# Patient Record
Sex: Male | Born: 1940 | Race: White | Hispanic: No | Smoking: Never smoker
Health system: Southern US, Community
[De-identification: ages and names within clinical notes are randomized; demographics above are authoritative.]

## PROBLEM LIST (undated history)

## (undated) DIAGNOSIS — N19 Unspecified kidney failure: Secondary | ICD-10-CM

## (undated) DIAGNOSIS — C801 Malignant (primary) neoplasm, unspecified: Secondary | ICD-10-CM

## (undated) DIAGNOSIS — K7460 Unspecified cirrhosis of liver: Secondary | ICD-10-CM

## (undated) DIAGNOSIS — I2510 Atherosclerotic heart disease of native coronary artery without angina pectoris: Secondary | ICD-10-CM

## (undated) HISTORY — PX: COLON SURGERY: SHX602

## (undated) HISTORY — PX: CARDIAC SURGERY: SHX584

---

## 2018-04-03 ENCOUNTER — Emergency Department (HOSPITAL_COMMUNITY): Payer: Medicare Other

## 2018-04-03 ENCOUNTER — Encounter (HOSPITAL_COMMUNITY): Payer: Self-pay | Admitting: Emergency Medicine

## 2018-04-03 ENCOUNTER — Inpatient Hospital Stay (HOSPITAL_COMMUNITY)
Admission: EM | Admit: 2018-04-03 | Discharge: 2018-04-04 | DRG: 682 | Disposition: A | Discharge status: Home / Self Care | Payer: Medicare Other | Attending: Internal Medicine | Admitting: Internal Medicine

## 2018-04-03 DIAGNOSIS — K7031 Alcoholic cirrhosis of liver with ascites: Secondary | ICD-10-CM | POA: Diagnosis present

## 2018-04-03 DIAGNOSIS — Z809 Family history of malignant neoplasm, unspecified: Secondary | ICD-10-CM

## 2018-04-03 DIAGNOSIS — Z992 Dependence on renal dialysis: Secondary | ICD-10-CM

## 2018-04-03 DIAGNOSIS — Z85038 Personal history of other malignant neoplasm of large intestine: Secondary | ICD-10-CM

## 2018-04-03 DIAGNOSIS — J9811 Atelectasis: Secondary | ICD-10-CM | POA: Diagnosis present

## 2018-04-03 DIAGNOSIS — J811 Chronic pulmonary edema: Secondary | ICD-10-CM | POA: Diagnosis not present

## 2018-04-03 DIAGNOSIS — J449 Chronic obstructive pulmonary disease, unspecified: Secondary | ICD-10-CM | POA: Diagnosis present

## 2018-04-03 DIAGNOSIS — R0602 Shortness of breath: Secondary | ICD-10-CM

## 2018-04-03 DIAGNOSIS — I2510 Atherosclerotic heart disease of native coronary artery without angina pectoris: Secondary | ICD-10-CM | POA: Diagnosis present

## 2018-04-03 DIAGNOSIS — K7290 Hepatic failure, unspecified without coma: Secondary | ICD-10-CM | POA: Diagnosis present

## 2018-04-03 DIAGNOSIS — N2581 Secondary hyperparathyroidism of renal origin: Secondary | ICD-10-CM | POA: Diagnosis present

## 2018-04-03 DIAGNOSIS — J90 Pleural effusion, not elsewhere classified: Secondary | ICD-10-CM | POA: Diagnosis not present

## 2018-04-03 DIAGNOSIS — Z9049 Acquired absence of other specified parts of digestive tract: Secondary | ICD-10-CM

## 2018-04-03 DIAGNOSIS — I120 Hypertensive chronic kidney disease with stage 5 chronic kidney disease or end stage renal disease: Principal | ICD-10-CM | POA: Diagnosis present

## 2018-04-03 DIAGNOSIS — Z7982 Long term (current) use of aspirin: Secondary | ICD-10-CM | POA: Diagnosis not present

## 2018-04-03 DIAGNOSIS — Z951 Presence of aortocoronary bypass graft: Secondary | ICD-10-CM | POA: Diagnosis not present

## 2018-04-03 DIAGNOSIS — R609 Edema, unspecified: Secondary | ICD-10-CM

## 2018-04-03 DIAGNOSIS — D631 Anemia in chronic kidney disease: Secondary | ICD-10-CM | POA: Diagnosis present

## 2018-04-03 DIAGNOSIS — Z79899 Other long term (current) drug therapy: Secondary | ICD-10-CM

## 2018-04-03 DIAGNOSIS — R600 Localized edema: Secondary | ICD-10-CM

## 2018-04-03 DIAGNOSIS — N186 End stage renal disease: Secondary | ICD-10-CM | POA: Diagnosis present

## 2018-04-03 HISTORY — DX: Malignant (primary) neoplasm, unspecified: C80.1

## 2018-04-03 HISTORY — DX: Atherosclerotic heart disease of native coronary artery without angina pectoris: I25.10

## 2018-04-03 HISTORY — DX: Unspecified cirrhosis of liver: K74.60

## 2018-04-03 LAB — COMPREHENSIVE METABOLIC PANEL
ALT: 20 U/L (ref 0–44)
AST: 38 U/L (ref 15–41)
Albumin: 3 g/dL — ABNORMAL LOW (ref 3.5–5.0)
Alkaline Phosphatase: 99 U/L (ref 38–126)
Anion gap: 16 — ABNORMAL HIGH (ref 5–15)
BUN: 29 mg/dL — ABNORMAL HIGH (ref 8–23)
CO2: 22 mmol/L (ref 22–32)
Calcium: 8.8 mg/dL — ABNORMAL LOW (ref 8.9–10.3)
Chloride: 98 mmol/L (ref 98–111)
Creatinine, Ser: 3.68 mg/dL — ABNORMAL HIGH (ref 0.61–1.24)
GFR calc non Af Amer: 15 mL/min — ABNORMAL LOW (ref >60)
GFR, EST AFRICAN AMERICAN: 17 mL/min — AB (ref >60)
Glucose, Bld: 111 mg/dL — ABNORMAL HIGH (ref 70–99)
Potassium: 4.4 mmol/L (ref 3.5–5.1)
SODIUM: 136 mmol/L (ref 135–145)
Total Bilirubin: 0.9 mg/dL (ref 0.3–1.2)
Total Protein: 6.6 g/dL (ref 6.5–8.1)

## 2018-04-03 LAB — POCT I-STAT EG7
Acid-Base Excess: 2 mmol/L (ref 0.0–2.0)
Bicarbonate: 25.5 mmol/L (ref 20.0–28.0)
CALCIUM ION: 0.99 mmol/L — AB (ref 1.15–1.40)
HCT: 32 % — ABNORMAL LOW (ref 39.0–52.0)
Hemoglobin: 10.9 g/dL — ABNORMAL LOW (ref 13.0–17.0)
O2 Saturation: 96 %
PCO2 VEN: 33.8 mmHg — AB (ref 44.0–60.0)
Potassium: 4.3 mmol/L (ref 3.5–5.1)
Sodium: 133 mmol/L — ABNORMAL LOW (ref 135–145)
TCO2: 27 mmol/L (ref 22–32)
pH, Ven: 7.485 — ABNORMAL HIGH (ref 7.250–7.430)
pO2, Ven: 73 mmHg — ABNORMAL HIGH (ref 32.0–45.0)

## 2018-04-03 LAB — CBC WITH DIFFERENTIAL/PLATELET
Abs Immature Granulocytes: 0.04 10*3/uL (ref 0.00–0.07)
Basophils Absolute: 0 10*3/uL (ref 0.0–0.1)
Basophils Relative: 0 %
EOS ABS: 0.1 10*3/uL (ref 0.0–0.5)
EOS PCT: 1 %
HCT: 32.4 % — ABNORMAL LOW (ref 39.0–52.0)
Hemoglobin: 10.1 g/dL — ABNORMAL LOW (ref 13.0–17.0)
Immature Granulocytes: 1 %
LYMPHS PCT: 20 %
Lymphs Abs: 1.6 10*3/uL (ref 0.7–4.0)
MCH: 26 pg (ref 26.0–34.0)
MCHC: 31.2 g/dL (ref 30.0–36.0)
MCV: 83.3 fL (ref 80.0–100.0)
Monocytes Absolute: 0.9 10*3/uL (ref 0.1–1.0)
Monocytes Relative: 11 %
Neutro Abs: 5.3 10*3/uL (ref 1.7–7.7)
Neutrophils Relative %: 67 %
Platelets: 153 10*3/uL (ref 150–400)
RBC: 3.89 MIL/uL — ABNORMAL LOW (ref 4.22–5.81)
RDW: 17.3 % — AB (ref 11.5–15.5)
WBC: 8 10*3/uL (ref 4.0–10.5)
nRBC: 0.3 % — ABNORMAL HIGH (ref 0.0–0.2)

## 2018-04-03 LAB — IRON AND TIBC
Iron: 23 ug/dL — ABNORMAL LOW (ref 45–182)
Saturation Ratios: 8 % — ABNORMAL LOW (ref 17.9–39.5)
TIBC: 280 ug/dL (ref 250–450)
UIBC: 257 ug/dL

## 2018-04-03 LAB — MAGNESIUM: Magnesium: 1.9 mg/dL (ref 1.7–2.4)

## 2018-04-03 LAB — PROTIME-INR
INR: 1.4 — ABNORMAL HIGH (ref 0.8–1.2)
Prothrombin Time: 17.1 seconds — ABNORMAL HIGH (ref 11.4–15.2)

## 2018-04-03 LAB — FERRITIN: Ferritin: 180 ng/mL (ref 24–336)

## 2018-04-03 LAB — I-STAT TROPONIN, ED: Troponin i, poc: 0.04 ng/mL (ref 0.00–0.08)

## 2018-04-03 LAB — PHOSPHORUS: Phosphorus: 3.3 mg/dL (ref 2.5–4.6)

## 2018-04-03 LAB — BRAIN NATRIURETIC PEPTIDE: B Natriuretic Peptide: 1612.8 pg/mL — ABNORMAL HIGH (ref 0.0–100.0)

## 2018-04-03 LAB — I-STAT CREATININE, ED: Creatinine, Ser: 3.8 mg/dL — ABNORMAL HIGH (ref 0.61–1.24)

## 2018-04-03 LAB — CBG MONITORING, ED: Glucose-Capillary: 96 mg/dL (ref 70–99)

## 2018-04-03 MED ORDER — ATORVASTATIN CALCIUM 80 MG PO TABS
80.0000 mg | ORAL_TABLET | Freq: Every day | ORAL | Status: DC
  Administered 2018-04-03: 80 mg via ORAL
  Filled 2018-04-03 (×2): qty 1

## 2018-04-03 MED ORDER — HEPARIN SODIUM (PORCINE) 1000 UNIT/ML DIALYSIS
4500.0000 [IU] | INTRAMUSCULAR | Status: DC | PRN
  Administered 2018-04-03: 4500 [IU]

## 2018-04-03 MED ORDER — FUROSEMIDE 40 MG PO TABS: 40.0000 mg | ORAL_TABLET | Freq: Every day | ORAL | Status: DC

## 2018-04-03 MED ORDER — LIDOCAINE HCL (PF) 1 % IJ SOLN: 5.0000 mL | INTRAMUSCULAR | Status: DC | PRN

## 2018-04-03 MED ORDER — SODIUM CHLORIDE 0.9 % IV SOLN: 100.0000 mL | INTRAVENOUS | Status: DC | PRN

## 2018-04-03 MED ORDER — FOLIC ACID 1 MG PO TABS
1.0000 mg | ORAL_TABLET | Freq: Every day | ORAL | Status: DC
  Administered 2018-04-03: 1 mg via ORAL
  Filled 2018-04-03 (×2): qty 1

## 2018-04-03 MED ORDER — CHLORHEXIDINE GLUCONATE CLOTH 2 % EX PADS: 6.0000 | MEDICATED_PAD | Freq: Every day | CUTANEOUS | Status: DC

## 2018-04-03 MED ORDER — ASPIRIN EC 81 MG PO TBEC
81.0000 mg | DELAYED_RELEASE_TABLET | Freq: Every day | ORAL | Status: DC
  Filled 2018-04-03: qty 1

## 2018-04-03 MED ORDER — PANTOPRAZOLE SODIUM 20 MG PO TBEC
20.0000 mg | DELAYED_RELEASE_TABLET | Freq: Every day | ORAL | Status: DC
  Filled 2018-04-03: qty 1

## 2018-04-03 MED ORDER — HEPARIN SODIUM (PORCINE) 1000 UNIT/ML IJ SOLN
INTRAMUSCULAR | Status: AC
  Filled 2018-04-03: qty 5

## 2018-04-03 MED ORDER — PENTAFLUOROPROP-TETRAFLUOROETH EX AERO: 1.0000 "application " | INHALATION_SPRAY | CUTANEOUS | Status: DC | PRN

## 2018-04-03 MED ORDER — HEPARIN SODIUM (PORCINE) 1000 UNIT/ML DIALYSIS: 40.0000 [IU]/kg | INTRAMUSCULAR | Status: DC | PRN

## 2018-04-03 MED ORDER — LACTULOSE 10 GM/15ML PO SOLN
5.0000 g | Freq: Two times a day (BID) | ORAL | Status: DC
  Administered 2018-04-04: 5 g via ORAL
  Filled 2018-04-03 (×3): qty 15

## 2018-04-03 MED ORDER — VITAMIN B-1 100 MG PO TABS
100.0000 mg | ORAL_TABLET | Freq: Every day | ORAL | Status: DC
  Filled 2018-04-03: qty 1

## 2018-04-03 MED ORDER — HEPARIN SODIUM (PORCINE) 1000 UNIT/ML DIALYSIS
1000.0000 [IU] | INTRAMUSCULAR | Status: DC | PRN
  Filled 2018-04-03: qty 1

## 2018-04-03 MED ORDER — LIDOCAINE-PRILOCAINE 2.5-2.5 % EX CREA
1.0000 "application " | TOPICAL_CREAM | CUTANEOUS | Status: DC | PRN
  Filled 2018-04-03: qty 5

## 2018-04-03 MED ORDER — MIRTAZAPINE 15 MG PO TABS
7.5000 mg | ORAL_TABLET | Freq: Every day | ORAL | Status: DC
  Administered 2018-04-03: 15 mg via ORAL
  Filled 2018-04-03 (×2): qty 1

## 2018-04-03 MED ORDER — ACETAMINOPHEN 650 MG RE SUPP: 650.0000 mg | Freq: Four times a day (QID) | RECTAL | Status: DC | PRN

## 2018-04-03 MED ORDER — ALTEPLASE 2 MG IJ SOLR
2.0000 mg | Freq: Once | INTRAMUSCULAR | Status: DC | PRN
  Filled 2018-04-03: qty 2

## 2018-04-03 MED ORDER — HEPARIN SODIUM (PORCINE) 5000 UNIT/ML IJ SOLN
5000.0000 [IU] | Freq: Three times a day (TID) | INTRAMUSCULAR | Status: DC
  Filled 2018-04-03 (×2): qty 1

## 2018-04-03 MED ORDER — ACETAMINOPHEN 325 MG PO TABS: 650.0000 mg | ORAL_TABLET | Freq: Four times a day (QID) | ORAL | Status: DC | PRN

## 2018-04-03 NOTE — H&P (Addendum)
Date: 04/03/2018               Patient Name:  Larry Fitzgerald MRN: [id number]  DOB: [birth year] Age / Sex: 78 y.o., male   PCP: Mancel Bale, PA-C         Medical Service: Internal Medicine Teaching Service         Attending Physician: Dr. Aldine Contes, MD    First Contact: Dr. Myrtie Hawk Pager: [phone]  Second Contact: Dr. Shan Levans Pager: [phone]       After Hours (After 5p/  First Contact Pager: [phone]  weekends / holidays): Second Contact Pager: [phone]   Chief Complaint: Lower extremity edema  History of Present Illness: Larry Fitzgerald is a 78 year old male with past medical history of ESRD, started on hemodialysis on January (from CABG), CAD status post CABG, colon cancer, cirrhosis, came into the ED to getting hemodialysis.  Patient's daughter is in the room, explaining that patient lives in [place]. He was admitted in  hospital there on January, got CABG, and started with hemodialysis due to new kidney failure.  He was then discharged to SNF and then to home with home health.  However per her daughter, he needed more care and moved to [place] to live with his daughter.  He missed his last hemodialysis. (waiting for the process to get set up for dialysis here in [place])  He developed some lower extremity edema. Daughters mentions that he is otherwise asymptomatic and overally doing ok.No shortness of breath, no N/V.   Meds:  Current Meds  Medication Sig  . amLODipine (NORVASC) 10 MG tablet Take 10 mg by mouth daily.  Marland Kitchen aspirin EC 81 MG tablet Take 81 mg by mouth daily.  Marland Kitchen atorvastatin (LIPITOR) 80 MG tablet Take 80 mg by mouth daily.  . citalopram (CELEXA) 20 MG tablet Take 20 mg by mouth daily.  . furosemide (LASIX) 40 MG tablet Take 40 mg by mouth daily.  Marland Kitchen lactulose (CHRONULAC) 10 GM/15ML solution Take 20 g by mouth 2 (two) times daily. 5 ml twice daily  . metoprolol tartrate (LOPRESSOR) 25 MG tablet Take 25 mg by mouth 2 (two) times daily.  . mirtazapine  (REMERON) 15 MG tablet Take 7.5 mg by mouth at bedtime.  . Misc Natural Products (GLUCOS-CHONDROIT-MSM COMPLEX PO) Take 1 capsule by mouth daily.  . Multiple Vitamins-Minerals (EQ MULTIVITAMINS ADULT GUMMY) CHEW Chew 1 tablet by mouth daily.  Marland Kitchen omeprazole (PRILOSEC) 20 MG capsule Take 20 mg by mouth daily.     Allergies: Allergies as of 04/03/2018 - Review Complete 04/03/2018  Allergen Reaction Noted  . Penicillins Anaphylaxis 04/03/2018   Past Medical History:  Diagnosis Date  . Cancer ([place])    colon ca   . Coronary artery disease   . Liver cirrhosis ([place])     Family History:  Mother had Cancer  No other known family Hx.   Social History:  Patient denies smoking, stopped drinking alcohol 3 years ago, denies illicit drug use  Review of Systems: A complete ROS was negative except as per HPI.   Physical Exam: Blood pressure (!) 102/59, pulse (!) 105, temperature 98 F (36.7 C), temperature source Oral, resp. rate 19, SpO2 95 %. Physical Exam Constitutional:      Appearance: He is ill-appearing.  HENT:     Head: Normocephalic and atraumatic.  Eyes:     Extraocular Movements: Extraocular movements intact.  Cardiovascular:     Rate and Rhythm: Tachycardia present.  Heart sounds: No murmur.  Pulmonary:     Effort: Pulmonary effort is normal.     Breath sounds: Examination of the right-lower field reveals decreased breath sounds. Examination of the left-lower field reveals decreased breath sounds. Decreased breath sounds present.  Abdominal:     General: Bowel sounds are normal. There is no distension.     Palpations: Abdomen is soft.     Tenderness: There is no abdominal tenderness.  Musculoskeletal:     Right lower leg: Edema present.     Left lower leg: Edema present.     Comments: 2-3+ LEE  Neurological:     General: No focal deficit present.     Mental Status: Mental status is at baseline.     EKG: personally reviewed my interpretation is sinus tachycardia  with non specific ST-T changes  CXR: personally reviewed my interpretation is lateral pleural effusion worse at left side.  Assessment & Plan by Problem: Active Problems:   ESRD (end stage renal disease) on dialysis [facility])  78 year old male past medical history of ESRD-acute renal failure from CABG, Cirrhosis, CABG Jan 2020, resented to ED to get hemodialysis.  ESRD: Started on HD on January. Last HD on Monday 03/31/2018 Was waiting in the process of getting set in HD center in [place] nephrology (will give multiple sessions while here to extend him to getting setup outpatient) -Follow HD orders -CBC -Renal function test -Follow-up PTH -INR  CAD s/p CABG: Symptomatic -Continue aspirin grams daily -Lipitor 80 mg daily  History of cirrhosis secondary to alcohol use -Vitamin B1 Meds: asa, remeron VTE ppx: subq hep IVF: none Diet: renal  Diet: Renal IV fluid: None VTE ppx: Subq Hep Code status: Full Dispo: Admit patient to Inpatient with expected length of stay greater than 2 midnights.  SignedDewayne Hatch, MD 04/03/2018, 2:11 PM  Pager: [phone]

## 2018-04-03 NOTE — ED Notes (Signed)
Pt ambulating to rest room.

## 2018-04-03 NOTE — Procedures (Signed)
Patient seen on Hemodialysis. BP (!) 110/59   Pulse (!) 105   Temp 97.6 F (36.4 C) (Oral)   Resp 18   Wt 76 kg   SpO2 91%   QB 400, UF goal 2.5L Tolerating treatment without complaints at this time.   Elmarie Shiley MD [facility]. Office # [phone] Pager # [phone] 4:46 PM

## 2018-04-03 NOTE — Consult Note (Signed)
Reason for Consult: End-stage renal disease, volume overload-attempting to transfer care Referring Physician: Aldine Contes M.D. ([facility])  HPI:  78 year old Caucasian man with past medical history significant for history of COPD, colon cancer status post partial colectomy in 2016, alcohol associated cirrhosis of the liver with prior history of recurrent ascites (resolved), history of thrombocytopenia, coronary artery disease status post three-vessel CABG 6 weeks ago complicated by development of dialysis dependent acute kidney injury since 03/01/2018.  He was discharged from [facility] to a local skilled nursing facility and was getting hemodialysis in [place], [place]-last treatment on Monday, 03/31/2018 following which the family decided to move him to [place] to be able to assist him at home.  Unfortunately, there were no firm plans for continued delivery of dialysis made prior to transfer.  He was seen 2 days ago at [facility] family medicine for concerns with cough and was noted to have significant edema.  He was then directed to the emergency room for further assistance.  In the emergency room he was noted to have significant lower extremity edema with bilateral large pleural effusions and compressive atelectasis.  He reports some shortness of breath but is otherwise comfortable without any fevers or chills and has had some cough with exertional dyspnea.  He denies any nausea, vomiting or diarrhea.  He reports that he has been producing some urine but is unable to quantify this.  Per his daughter, he had attempted 24-hour collection of urine that did not show sufficient renal recovery to allow stopping dialysis.  He has a right IJ TDC.  Past Medical History:  Diagnosis Date  . Cancer ([place])    colon ca   . Coronary artery disease   . Liver cirrhosis [facility])     Past Surgical History:  Procedure Laterality Date  . CARDIAC SURGERY     CABG   . COLON SURGERY      No family history on  file.  Social History:  reports that he has never smoked. He has never used smokeless tobacco. He reports previous alcohol use. He reports that he does not use drugs.  Allergies:  Allergies  Allergen Reactions  . Penicillins Anaphylaxis    Did it involve swelling oYesf the face/tongue/throat, SOB, or low BP? Yes Did it involve sudden or severe rash/hives, skin peeling, or any reaction on the inside of your mouth or nose?  Did you need to seek medical attention at a hospital or doctor's office? Yes When did it last happen? If all above answers are "NO", may proceed with cephalosporin use.    Medications:  Scheduled: . [START ON 04/04/2018] aspirin EC  81 mg Oral Daily  . atorvastatin  80 mg Oral Daily  . [START ON 04/04/2018] Chlorhexidine Gluconate Cloth  6 each Topical Q0600  . folic acid  1 mg Oral Daily  . [START ON 04/04/2018] furosemide  40 mg Oral Daily  . heparin  5,000 Units Subcutaneous Q8H  . lactulose  5 g Oral BID  . mirtazapine  7.5 mg Oral QHS  . [START ON 04/04/2018] pantoprazole  20 mg Oral Daily  . thiamine  100 mg Oral Daily    BMP Latest Ref Rng & Units 04/03/2018 04/03/2018  Glucose 70 - 99 mg/dL - 111(H)  BUN 8 - 23 mg/dL - 29(H)  Creatinine 0.61 - 1.24 mg/dL 3.80(H) 3.68(H)  Sodium 135 - 145 mmol/L 133(L) 136  Potassium 3.5 - 5.1 mmol/L 4.3 4.4  Chloride 98 - 111 mmol/L - 98  CO2 22 - 32 mmol/L - 22  Calcium 8.9 - 10.3 mg/dL - 8.8(L)   CBC Latest Ref Rng & Units 04/03/2018 04/03/2018  WBC 4.0 - 10.5 K/uL - 8.0  Hemoglobin 13.0 - 17.0 g/dL 10.9(L) 10.1(L)  Hematocrit 39.0 - 52.0 % 32.0(L) 32.4(L)  Platelets 150 - 400 K/uL - 153     Dg Chest Port 1 View  Result Date: 04/03/2018 CLINICAL DATA:  Patient in need of dialysis. Pitting edema. EXAM: PORTABLE CHEST 1 VIEW COMPARISON:  None. FINDINGS: Dialysis catheter terminates at the cavoatrial junction. Postsurgical changes from CABG. Dual lead cardiac defibrillator pacemaker in appropriate radiographic  position. Enlarged cardiac silhouette.  Mediastinal contours appear intact. Bilateral large pleural effusions with associated compressive atelectasis in the lung bases. Mild interstitial pulmonary edema. Osseous structures are without acute abnormality. Soft tissues are grossly normal. IMPRESSION: 1. Bilateral large pleural effusions with associated compressive atelectasis. 2. Interstitial pulmonary edema. 3. Enlarged cardiac silhouette. Electronically Signed   By: Fidela Salisbury M.D.   On: 04/03/2018 10:41    Review of Systems  Constitutional: Positive for malaise/fatigue. Negative for chills, fever and weight loss.  HENT: Negative.   Eyes: Negative.   Respiratory: Positive for cough and shortness of breath. Negative for hemoptysis, sputum production and wheezing.   Cardiovascular: Positive for orthopnea and leg swelling. Negative for chest pain, palpitations and claudication.  Gastrointestinal: Negative.   Genitourinary: Negative.   Musculoskeletal: Negative.   Skin: Negative.   Neurological: Positive for weakness.       Global weakness-following recent hospitalization   Blood pressure (!) 102/59, pulse (!) 105, temperature 98 F (36.7 C), temperature source Oral, resp. rate 20, SpO2 95 %. Physical Exam  Nursing note and vitals reviewed. Constitutional: He is oriented to person, place, and time. He appears well-developed and well-nourished. No distress.  HENT:  Head: Normocephalic and atraumatic.  Nose: Nose normal.  Mouth/Throat: Oropharynx is clear and moist.  Eyes: Pupils are equal, round, and reactive to light. Conjunctivae and EOM are normal. No scleral icterus.  Neck: Normal range of motion. Neck supple. JVD present.  Cardiovascular: Regular rhythm and normal heart sounds.  No murmur heard. Regular tachycardia, midline chest incision consistent with recent CABG  Respiratory: He has rales.  Diminished breath sounds over bilateral bases with poor inspiratory effort, fine  rales mid lung base.  Right IJ TDC  GI: Bowel sounds are normal. There is no abdominal tenderness. There is no rebound and no guarding.  Musculoskeletal: Normal range of motion.        General: Edema present.     Comments: 4+ pitting lower extremity edema  Neurological: He is alert and oriented to person, place, and time. No cranial nerve deficit.  Skin: Skin is warm and dry. There is pallor.  Psychiatric: He has a normal mood and affect. His behavior is normal.    Assessment/Plan: 1.  Acute kidney injury: Dialysis dependent for the past 4 weeks or so without any evidence of significant renal recovery based on his daughter's report regarding 24-hour urine collection.  Significant volume overload.  Will order for hemodialysis today and then again tomorrow anticipating discharge thereafter following which he can continue dialysis as an outpatient at the [facility] unit on a Tuesday/Thursday/Saturday schedule beginning Saturday, 04/05/2018.  We had a lengthy discussion regarding the trajectory of his management-if he does not have recovery over the next 2 weeks noted, will refer him to vascular surgery for placement of dialysis access.  Will check hepatitis B  serologies. 2.  Volume overload: Secondary to acute kidney injury and delayed renal recovery/poor urine output.  Will attempt frequent dialysis for the next 3 days and then scheduled hemodialysis as an outpatient. 3.  Anemia of chronic kidney disease: Hemoglobin and hematocrit are at goal, will resume ESA with hemodialysis.  Will check iron studies today. 4.  Secondary hyperparathyroidism: Calcium level acceptable, will add phosphorus and magnesium levels to labs previously drawn to decide on need for binders.  Will check PTH today. 5.  Coronary artery disease status post recent CABG: Will likely need to reestablish care with cardiology here for ongoing continuity of care. 6.  History of cirrhosis: Reiterated the importance of continued abstinence  from alcohol.  LFTs/bilirubin indicative of decent hepatic function.  Tylin Force K. 04/03/2018, 3:00 PM

## 2018-04-03 NOTE — ED Provider Notes (Signed)
Larry Fitzgerald   CSN: [id number] Arrival date & time: 04/03/18  3149    History   Chief Complaint Chief Complaint  Patient presents with  . Leg Swelling  . Vascular Access Problem    HPI Larry Fitzgerald is a 78 y.o. male.     The history is provided by the patient, medical records and a relative. No language interpreter was used.   Larry Fitzgerald is a 78 y.o. male who presents to the Emergency Department complaining of "needs dialysis." He presents to the emergency department accompanied by his daughter for evaluation of establishing dialysis. He has a complicated medical history and was admitted to a [facility] [place] two months ago. During his hospitalization he had a CABG and developed ESRD requiring hemodialysis as well as ascites and liver failure. He was discharged following his hospitalization to a rehab facility and was discharged from the rehab facility two weeks ago. His last dialysis session was on Monday. When his daughter came to visit he was complaining of weakness and fatigue and she brought him to [facility] so he can be closer to assistance. She has been attempting to establish him with outpatient dialysis but has been unable to do so locally and she brings him to the ER to get established with dialysis. He complains of worsening bilateral lower extremity edema as well as tachypnea, generalized weakness and malaise. No reports of fevers, chest pain, vomiting. Past Medical History:  Diagnosis Date  . Cancer ([place])    colon ca   . Coronary artery disease   . Liver cirrhosis [facility])     Patient Active Problem List   Diagnosis Date Noted  . ESRD (end stage renal disease) on dialysis ([place]) 04/03/2018    Past Surgical History:  Procedure Laterality Date  . CARDIAC SURGERY     CABG   . COLON SURGERY          Home Medications    Prior to Admission medications   Medication Sig Start Date  End Date Taking? Authorizing Provider  amLODipine (NORVASC) 10 MG tablet Take 10 mg by mouth daily. 02/19/18  Yes [provider]  aspirin EC 81 MG tablet Take 81 mg by mouth daily.   Yes [provider]  atorvastatin (LIPITOR) 80 MG tablet Take 80 mg by mouth daily. 02/19/18  Yes [provider]  citalopram (CELEXA) 20 MG tablet Take 20 mg by mouth daily. 03/13/18 04/12/18 Yes [provider]  furosemide (LASIX) 40 MG tablet Take 40 mg by mouth daily. 12/17/17  Yes [provider]  lactulose (CHRONULAC) 10 GM/15ML solution Take 20 g by mouth 2 (two) times daily. 5 ml twice daily 08/30/17  Yes [provider]  metoprolol tartrate (LOPRESSOR) 25 MG tablet Take 25 mg by mouth 2 (two) times daily. 03/13/18 04/12/18 Yes [provider]  mirtazapine (REMERON) 15 MG tablet Take 7.5 mg by mouth at bedtime.   Yes [provider]  Misc Natural Products (GLUCOS-CHONDROIT-MSM COMPLEX PO) Take 1 capsule by mouth daily.   Yes [provider]  Multiple Vitamins-Minerals (EQ MULTIVITAMINS ADULT GUMMY) CHEW Chew 1 tablet by mouth daily.   Yes [provider]  omeprazole (PRILOSEC) 20 MG capsule Take 20 mg by mouth daily. 01/20/16  Yes [provider]    Family History No family history on file.  Social History Social History   Tobacco Use  . Smoking status: Never Smoker  . Smokeless  tobacco: Never Used  Substance Use Topics  . 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 Drug use: Never     Allergies   Penicillins   Review of Systems Review of Systems  All other systems reviewed and are negative.    Physical Exam Updated Vital Signs BP 108/62 (BP Location: Right Arm)   Pulse (!) 106   Temp 97.6 F (36.4 C) (Oral)   Resp 18   Wt 76 kg   SpO2 91%   Physical Exam Vitals signs and nursing Fitzgerald reviewed.  Constitutional:      Appearance: He is well-developed.  He is ill-appearing.  HENT:     Head: Normocephalic and atraumatic.  Cardiovascular:     Rate and Rhythm: Regular rhythm.     Heart sounds: No murmur.     Comments: tachycardic Pulmonary:     Effort: Pulmonary effort is normal. No respiratory distress.     Comments: Decreased air movement in bilateral bases, left greater than right.  Abdominal:     Palpations: Abdomen is soft.     Tenderness: There is no abdominal tenderness. There is no guarding or rebound.  Musculoskeletal:        General: No tenderness.     Comments: 3+ pitting edema to BLE  Skin:    General: Skin is warm and dry.     Coloration: Skin is pale.  Neurological:     Mental Status: He is alert and oriented to person, place, and time.  Psychiatric:        Behavior: Behavior normal.      ED Treatments / Results  Labs (all labs ordered are listed, but only abnormal results are displayed) Labs Reviewed  COMPREHENSIVE METABOLIC PANEL - Abnormal; Notable for the following components:      Result Value   Glucose, Bld 111 (*)    BUN 29 (*)    Creatinine, Ser 3.68 (*)    Calcium 8.8 (*)    Albumin 3.0 (*)    GFR calc non Af Amer 15 (*)    GFR calc Af Amer 17 (*)    Anion gap 16 (*)    All other components within normal limits  CBC WITH DIFFERENTIAL/PLATELET - Abnormal; Notable for the following components:   RBC 3.89 (*)    Hemoglobin 10.1 (*)    HCT 32.4 (*)    RDW 17.3 (*)    nRBC 0.3 (*)    All other components within normal limits  BRAIN NATRIURETIC PEPTIDE - Abnormal; Notable for the following components:   B Natriuretic Peptide 1,612.8 (*)    All other components within normal limits  PROTIME-INR - Abnormal; Notable for the following components:   Prothrombin Time 17.1 (*)    INR 1.4 (*)    All other components within normal limits  I-STAT CREATININE, ED - Abnormal; Notable for the following components:   Creatinine, Ser 3.80 (*)    All other components within normal limits  POCT I-STAT EG7 -  Abnormal; Notable for the following components:   pH, Ven 7.485 (*)    pCO2, Ven 33.8 (*)    pO2, Ven 73.0 (*)    Sodium 133 (*)    Calcium, Ion 0.99 (*)    HCT 32.0 (*)    Hemoglobin 10.9 (*)    All other components within normal limits  PHOSPHORUS  MAGNESIUM  HEPATITIS B SURFACE ANTIGEN  HEPATITIS B  SURFACE ANTIGEN  HEPATITIS B SURFACE ANTIBODY,QUALITATIVE  HEPATITIS B CORE ANTIBODY, TOTAL  PARATHYROID HORMONE, INTACT (NO CA)  FERRITIN  IRON AND TIBC  CBG MONITORING, ED  I-STAT TROPONIN, ED    EKG EKG Interpretation  Date/Time:  Thursday April 03 2018 10:04:33 EST Ventricular Rate:  106 PR Interval:    QRS Duration: 102 QT Interval:  347 QTC Calculation: 461 R Axis:   100 Text Interpretation:  Sinus tachycardia Anterior infarct, old Nonspecific repol abnrm, inferolateral lds no prior available for comparison Confirmed by Quintella Reichert [phone]) on 04/03/2018 10:28:00 AM   Radiology Dg Chest Port 1 View  Result Date: 04/03/2018 CLINICAL DATA:  Patient in need of dialysis. Pitting edema. EXAM: PORTABLE CHEST 1 VIEW COMPARISON:  None. FINDINGS: Dialysis catheter terminates at the cavoatrial junction. Postsurgical changes from CABG. Dual lead cardiac defibrillator pacemaker in appropriate radiographic position. Enlarged cardiac silhouette.  Mediastinal contours appear intact. Bilateral large pleural effusions with associated compressive atelectasis in the lung bases. Mild interstitial pulmonary edema. Osseous structures are without acute abnormality. Soft tissues are grossly normal. IMPRESSION: 1. Bilateral large pleural effusions with associated compressive atelectasis. 2. Interstitial pulmonary edema. 3. Enlarged cardiac silhouette. Electronically Signed   By: Fidela Salisbury M.D.   On: 04/03/2018 10:41    Procedures Procedures (including critical care time)  Medications Ordered in ED Medications  heparin injection 5,000 Units (has no administration in time range)    folic acid (FOLVITE) tablet 1 mg (has no administration in time range)  thiamine (VITAMIN B-1) tablet 100 mg (has no administration in time range)  acetaminophen (TYLENOL) tablet 650 mg (has no administration in time range)    Or  acetaminophen (TYLENOL) suppository 650 mg (has no administration in time range)  aspirin EC tablet 81 mg (has no administration in time range)  atorvastatin (LIPITOR) tablet 80 mg (has no administration in time range)  furosemide (LASIX) tablet 40 mg (has no administration in time range)  lactulose (CHRONULAC) 10 GM/15ML solution 5 g (has no administration in time range)  mirtazapine (REMERON) tablet 7.5 mg (has no administration in time range)  pantoprazole (PROTONIX) EC tablet 20 mg (has no administration in time range)  Chlorhexidine Gluconate Cloth 2 % PADS 6 each (has no administration in time range)  pentafluoroprop-tetrafluoroeth (GEBAUERS) aerosol 1 application (has no administration in time range)  lidocaine (PF) (XYLOCAINE) 1 % injection 5 mL (has no administration in time range)  lidocaine-prilocaine (EMLA) cream 1 application (has no administration in time range)  0.9 %  sodium chloride infusion (has no administration in time range)  0.9 %  sodium chloride infusion (has no administration in time range)  heparin injection 1,000 Units (has no administration in time range)  alteplase (CATHFLO ACTIVASE) injection 2 mg (has no administration in time range)  heparin injection 40 Units/kg (has no administration in time range)  heparin injection 40 Units/kg (has no administration in time range)     Initial Impression / Assessment and Plan / ED Course  I have reviewed the triage vital signs and the nursing notes.  Pertinent labs & imaging results that were available during my care of the patient were reviewed by me and considered in my medical decision making (see chart for details).        Pt with ESRD, CHF here for evaluation for dialysis. He is  tachycardic, tachypnea, evaluation. Chest x-ray demonstrates bilateral pleural effusion's, left greater than right. He is volume overload on examination. Discussed with Dr. Posey Pronto with nephrology regarding  dialysis. Medicine consulted for admission for further treatment.  Patient and daughter updated of findings of studies and recommendation for admission.    Final Clinical Impressions(s) / ED Diagnoses   Final diagnoses:  None    ED Discharge Orders    None       Quintella Reichert, MD 04/03/18 [phone]

## 2018-04-03 NOTE — ED Notes (Signed)
Report called to Dialysis.

## 2018-04-03 NOTE — ED Triage Notes (Signed)
Pt arrives with daughter with a c/o of needing dialysis due to patient being discharged from [facility] following CABG which resulted in AKI with placement of catheter in right upper chest. Pt had dialysis on Monday at [facility] and per daughter was told to come to ED for dialysis due to pt not having a place to go. Pt has +3 bilateral pitting edema. Pt was prescribed 2L [place] upon discharge but left tank at home. Pt denies any sob or chest pain.

## 2018-04-04 ENCOUNTER — Other Ambulatory Visit: Payer: Self-pay

## 2018-04-04 DIAGNOSIS — Z85038 Personal history of other malignant neoplasm of large intestine: Secondary | ICD-10-CM

## 2018-04-04 DIAGNOSIS — Z951 Presence of aortocoronary bypass graft: Secondary | ICD-10-CM

## 2018-04-04 DIAGNOSIS — Z7982 Long term (current) use of aspirin: Secondary | ICD-10-CM

## 2018-04-04 DIAGNOSIS — K7030 Alcoholic cirrhosis of liver without ascites: Secondary | ICD-10-CM | POA: Diagnosis not present

## 2018-04-04 DIAGNOSIS — I120 Hypertensive chronic kidney disease with stage 5 chronic kidney disease or end stage renal disease: Secondary | ICD-10-CM | POA: Diagnosis not present

## 2018-04-04 DIAGNOSIS — E8770 Fluid overload, unspecified: Secondary | ICD-10-CM | POA: Diagnosis not present

## 2018-04-04 DIAGNOSIS — N186 End stage renal disease: Secondary | ICD-10-CM | POA: Diagnosis not present

## 2018-04-04 DIAGNOSIS — R000 Tachycardia, unspecified: Secondary | ICD-10-CM

## 2018-04-04 DIAGNOSIS — D649 Anemia, unspecified: Secondary | ICD-10-CM

## 2018-04-04 DIAGNOSIS — Z880 Allergy status to penicillin: Secondary | ICD-10-CM

## 2018-04-04 DIAGNOSIS — Z79899 Other long term (current) drug therapy: Secondary | ICD-10-CM

## 2018-04-04 DIAGNOSIS — I2510 Atherosclerotic heart disease of native coronary artery without angina pectoris: Secondary | ICD-10-CM

## 2018-04-04 DIAGNOSIS — Z992 Dependence on renal dialysis: Secondary | ICD-10-CM

## 2018-04-04 LAB — CBC
HCT: 31.2 % — ABNORMAL LOW (ref 39.0–52.0)
Hemoglobin: 9.3 g/dL — ABNORMAL LOW (ref 13.0–17.0)
MCH: 23.3 pg — ABNORMAL LOW (ref 26.0–34.0)
MCHC: 29.8 g/dL — ABNORMAL LOW (ref 30.0–36.0)
MCV: 78.2 fL — ABNORMAL LOW (ref 80.0–100.0)
NRBC: 0 % (ref 0.0–0.2)
Platelets: 94 10*3/uL — ABNORMAL LOW (ref 150–400)
RBC: 3.99 MIL/uL — ABNORMAL LOW (ref 4.22–5.81)
RDW: 16.8 % — ABNORMAL HIGH (ref 11.5–15.5)
WBC: 6.3 10*3/uL (ref 4.0–10.5)

## 2018-04-04 LAB — HEPATITIS B CORE ANTIBODY, TOTAL: Hep B Core Total Ab: NEGATIVE

## 2018-04-04 LAB — PROTIME-INR
INR: 1.5 — ABNORMAL HIGH (ref 0.8–1.2)
PROTHROMBIN TIME: 17.9 s — AB (ref 11.4–15.2)

## 2018-04-04 LAB — HEPATITIS B SURFACE ANTIGEN
HEP B S AG: NEGATIVE
Hepatitis B Surface Ag: NEGATIVE

## 2018-04-04 LAB — HEPATITIS B SURFACE ANTIBODY,QUALITATIVE: Hep B S Ab: NONREACTIVE

## 2018-04-04 LAB — MRSA PCR SCREENING: MRSA by PCR: NEGATIVE

## 2018-04-04 MED ORDER — HEPARIN SODIUM (PORCINE) 1000 UNIT/ML IJ SOLN
INTRAMUSCULAR | Status: AC
  Filled 2018-04-04: qty 5

## 2018-04-04 NOTE — Progress Notes (Signed)
Patient ID: Larry Fitzgerald, male   DOB: Sep 08, 1940, 78 y.o.   MRN: [id number]  [facility] Progress Note    Subjective:    no new complaints but asking when he can go home   Objective:   BP (!) 108/56   Pulse (!) 109   Temp 97.6 F (36.4 C) (Oral)   Resp 19   Ht 5\' 7"  (1.702 m)   Wt 74 kg   SpO2 (!) 89%   BMI 25.55 kg/m   Intake/Output: I/O last 3 completed shifts: In: 75 [P.O.:460] Out: 2100 [Other:2100]   Intake/Output this shift:  Total I/O In: 300 [P.O.:300] Out: 0  Weight change:   Physical Exam: Gen: frail elderly WM in NAD CVS: tachy Resp: bibasilar crackles Abd: +BS, soft, NT/ND Ext: 3+ edema of lower ext.  Labs: BMET Recent Labs  Lab 04/03/18 1046 04/03/18 1052 04/03/18 1537 04/04/18 0421  NA 136 133*  --  136  K 4.4 4.3  --  3.1*  CL 98  --   --  99  CO2 22  --   --  26  GLUCOSE 111*  --   --  97  BUN 29*  --   --  12  CREATININE 3.68* 3.80*  --  2.16*  ALBUMIN 3.0*  --   --  2.7*  CALCIUM 8.8*  --   --  7.8*  PHOS  --   --  3.3  --    CBC Recent Labs  Lab 04/03/18 1046 04/03/18 1052 04/04/18 0421  WBC 8.0  --  6.3  NEUTROABS 5.3  --   --   HGB 10.1* 10.9* 9.3*  HCT 32.4* 32.0* 31.2*  MCV 83.3  --  78.2*  PLT 153  --  94*    @IMGRELPRIORS @ Medications:    . aspirin EC  81 mg Oral Daily  . atorvastatin  80 mg Oral Daily  . Chlorhexidine Gluconate Cloth  6 each Topical Q0600  . folic acid  1 mg Oral Daily  . furosemide  40 mg Oral Daily  . heparin  5,000 Units Subcutaneous Q8H  . lactulose  5 g Oral BID  . mirtazapine  7.5 mg Oral QHS  . pantoprazole  20 mg Oral Daily  . thiamine  100 mg Oral Daily     Assessment/ Plan:   1. Volume overload- due to lack of HD after being moved without arranging for outpatient HD.  Improving with HD and UF.  2. ESRD  Arranging for outpatient HD here in [place] 3. Vascular access- has rij tdc and left PPM, will need referral to vascular surgery for AVG/AVF as an  outpatient.  4. 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 Nutrition: renal diet 7. Hypertension: stable 8. CAD s/p CABG- will need to establish Cardiology care here 9. Cirrhosis- ?related to Etoh.  Will need to remain abstinent from alcohol and restrict fluid and Na 10. Disposition- hopeful discharge to home after HD today.  He is being set up at [facility] kidney center.  Awaiting chair time.   Donetta Potts, MD [place] Pager [phone] 04/04/2018, 11:54 AM

## 2018-04-04 NOTE — Progress Notes (Signed)
  Date: 04/04/2018  Patient name: Larry Fitzgerald  Medical record number: [id number]  Date of birth: August 30, 1940   I have seen and evaluated Larry Fitzgerald and discussed their care with the Residency Team.  In brief, patient is a 78 year old male with a past medical history of ESRD on hemodialysis, CAD status post CABG, colon cancer, cirrhosis who came into the ED for worsening fluid overload secondary to missed hemodialysis session.  Patient was admitted to [facility] in January and had a CABG done while there.  His hospital course was complicated by worsening renal failure requiring starting of hemodialysis.  Patient was then discharged to a SNF from the hospital and then went home with home health.  Patient's daughter (per chart) brought the patient to [facility] as she felt he needed more care and wanted him to live with her.  Patient missed his last hemodialysis session as his care was then transferred over to hemodialysis center here yet.  He came to the hospital secondary to worsening fluid overload in setting of this missed hemodialysis session.  Patient also complained of some lower extremity swelling as well.  No chest pain, no palpitations, no lightheadedness, no abdominal pain, no diarrhea, no focal weakness, no fevers or chills, no headache, no blurry vision.  Today patient states that his legs are still swollen but denies any other complaints currently.  PMHx, Fam Hx, and/or Soc Hx : As per resident admit note  Vitals:   04/04/18 1400 04/04/18 1430  BP: 111/60 109/64  Pulse: (!) 113 (!) 114  Resp:    Temp:    SpO2:     General: Awake, alert, answers questions appropriately Lungs: Diminished breath sounds at bilateral bases CVS: Tachycardic, regular rhythm, normal heart sounds Abdomen: Soft, nontender, nondistended, normoactive bowel sounds Extremities: 2+ bilateral lower extremity pitting edema noted  Assessment and Plan: I have seen and evaluated the patient as outlined  above. I agree with the formulated Assessment and Plan as detailed in the residents' note, with the following changes:   1.  Volume overload: -Patient presented to the ED after missing his last hemodialysis session secondary to his move to [facility] from [facility].  Patient was noted to have pulmonary edema as well as bilateral pleural effusions on his chest x-ray consistent with fluid overload. -Patient is status post hemodialysis yesterday and is scheduled for hemodialysis again today. -Nephrology follow-up and recommendations appreciated.  We will continue with hemodialysis per nephrology. -Patient will need referral to vascular surgery as an outpatient for AV fistula placement -Patient status post hemodialysis yesterday denies any shortness of breath today but still has significant lower extremity edema as well as diminished breath sounds at his bases on auscultation consistent with his pleural effusions. -Patient is being set up at [facility] kidney center for outpatient hemodialysis.  He is awaiting a chair time still.  Patient stable for DC home once his outpatient hemodialysis has been set up.  Larry Contes, MD 2/28/20203:19 PM

## 2018-04-04 NOTE — Progress Notes (Signed)
   Subjective: Patient was seen and evaluated at bedside on morning rounds. No acute events overnight. He got HD yesterday. Explaining to him that he will need to continue HD here until be set up for out patient HD.  He initially says that he thought he can go home today, then says that he needs the fluid in his legs be removed before he goes home. He does not have any acute complaints and mentions,"I can not run but I am feeling well!"   Objective:  Vital signs in last 24 hours: Vitals:   04/03/18 1930 04/03/18 2000 04/04/18 0503 04/04/18 1120  BP: 108/60 106/66 (!) 108/56 (!) 108/56  Pulse: (!) 106 (!) 108 (!) 109 (!) 109  Resp: 20 19 19 19   Temp: 98 F (36.7 C) 97.9 F (36.6 C) 97.6 F (36.4 C) 97.6 F (36.4 C)  TempSrc: Oral Oral Oral Oral  SpO2: 95% 92% (!) 89%   Weight: 74 kg   74 kg  Height:    5\' 7"  (1.702 m)   Physical Exam  Constitutional: Thin appearing gentleman. In No distress.  HENT:  Head: Normocephalic and atraumatic.  Cardiovascular: Tachycardic, regular rhythm and normal heart sounds.  Respiratory: Effort normal and breath sounds normal. No respiratory distress.  GI: Soft. Bowel sounds are normal. No distension. There is no tenderness at RUQ and rest of .  Musculoskeletal: No edema.  Neurological: Is alert.  Skin: Not diaphoretic. No erythema.  Psychiatric: Normal mood and affect. Behavior is normal. Judgment and thought content normal.    Assessment/Plan:  Active Problems:   ESRD (end stage renal disease) on dialysis [facility])  78 year old male past medical history of ESRD-acute renal failure from CABG, Cirrhosis, CABG Jan 2020, resented to ED to get hemodialysis.  ESRD: Started on HD on January. Last HD on Monday 03/31/2018 (missed 1 dose dose) Is waiting in the process of getting set in HD center in [place].  Got HD yesterday. Cr at 2.16 today (was 3.8 yesterday) K at 3.1 today. Nothing to do since on HD. He clinically looks better today, but still  significant LEE. Will continue HD session per nephro for volume reduction.  -Nephrology is on board (will give multiple sessions while here while getting setup for outpatient HD). Appreciate recommendation. -HD per nephro.  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  -Continue Vitamin B1 -Continue Lactulose 5 gr oral BID  Diet: Renal IV fluid: None VTE ppx: Subq Hep Code status: Full   Dispo: Anticipated discharge depends on when set up for outpatient HD  Dewayne Hatch, MD 04/04/2018, 11:39 AM Pager: [phone]

## 2018-04-04 NOTE — Discharge Summary (Signed)
Name: Larry Fitzgerald MRN: [id number] DOB: Jun 18, 1940 78 y.o. PCP: Mittie Bodo  Date of Admission: 04/03/2018  9:59 AM Date of Discharge: 04/04/2018 Attending Physician: No att. providers found  Discharge Diagnosis: 1. Active Problems:   ESRD (end stage renal disease) on dialysis [facility])    Discharge Medications: Allergies as of 04/04/2018      Reactions   Penicillins Anaphylaxis   Did it involve swelling oYesf the face/tongue/throat, SOB, or low BP? Yes Did it involve sudden or severe rash/hives, skin peeling, or any reaction on the inside of your mouth or nose?  Did you need to seek medical attention at a hospital or doctor's office? Yes When did it last happen? If all above answers are "NO", may proceed with cephalosporin use.      Medication List    STOP taking these medications   amLODipine 10 MG tablet Commonly known as:  NORVASC     TAKE these medications   aspirin EC 81 MG tablet Take 81 mg by mouth daily.   atorvastatin 80 MG tablet Commonly known as:  LIPITOR Take 80 mg by mouth daily.   citalopram 20 MG tablet Commonly known as:  CELEXA Take 20 mg by mouth daily.   EQ MULTIVITAMINS ADULT GUMMY Chew Chew 1 tablet by mouth daily.   furosemide 40 MG tablet Commonly known as:  LASIX Take 40 mg by mouth daily.   GLUCOS-CHONDROIT-MSM COMPLEX PO Take 1 capsule by mouth daily.   lactulose 10 GM/15ML solution Commonly known as:  CHRONULAC Take 20 g by mouth 2 (two) times daily. 5 ml twice daily   metoprolol tartrate 25 MG tablet Commonly known as:  LOPRESSOR Take 25 mg by mouth 2 (two) times daily.   mirtazapine 15 MG tablet Commonly known as:  REMERON Take 7.5 mg by mouth at bedtime.   omeprazole 20 MG capsule Commonly known as:  PRILOSEC Take 20 mg by mouth daily.       Disposition and follow-up:   Mr.Larry Fitzgerald was discharged from [facility] in Stable condition.  At the hospital follow up visit please  address:  1.  Please make sure patient started getting outpatient HD at new center: at [facility] kidney center.   2.  Labs / imaging needed at time of follow-up: CMP, CBC  3.  Pending labs/ test needing follow-up: None  Follow-up Appointments:   Hospital Course by problem list: 1. Lower extremity edema due to Missed HD: 78 year old male past medical history ofESRD-acute renal failure from CABG, Cirrhosis, CABG Jan 2020,brought to ED by her daugther to get hemodialysis. Patient used to live in [facility]. He was admitted in [facility] at January, where he got CABG, and started with hemodialysis due to new kidney failure after surgery.  He was then discharged to SNF and then to home with home health.  However per her daughter, he needed more care and moved to [place] to live with his daughter.  He missed his last hemodialysis. (waiting for the process to get set up for dialysis here in [place]). He developed some lower extremity edema. Daughters mentions that he is otherwise asymptomatic and overally doing ok without shortness of breath, nausea or vomiting. Patient admitted at [facility] Internal medicine service to get HD. Nephrology followed the patient. He got HD x2 while setting up out patient HD. He was discharged to continue HD at [facility] kidney center.  2.CAD s/p CABG: Patient was asymptomatic and stable during this  admission. I stat Trop was negative. Home ASA and Lipitor continued.  3. Alcoholic cirrhosis: Patient stable and without symptoms and w/u asterixis or encephalopathy on exam. LFT was normal. We continued Lactolose 5 gr BID and gave him PO daily Thiamin while in hospital.   3.Normocytic anemia: Ferritin Nl. Hb stable>9. No evidence of bleeding and likely due to ESRD.  Discharge Vitals:   BP (!) 105/52 (BP Location: Left Arm)   Pulse (!) 118   Temp (!) 97.3 F (36.3 C) (Oral)   Resp 20   Ht 5\' 7"  (1.702 m)   Wt 69.5 kg Comment: standing weight   SpO2 93%   BMI 24.00 kg/m   Pertinent Labs, Studies, and Procedures:  CMP Latest Ref Rng & Units 04/04/2018 04/03/2018 04/03/2018  Glucose 70 - 99 mg/dL 97 - 111(H)  BUN 8 - 23 mg/dL 12 - 29(H)  Creatinine 0.61 - 1.24 mg/dL 2.16(H) 3.80(H) 3.68(H)  Sodium 135 - 145 mmol/L 136 133(L) 136  Potassium 3.5 - 5.1 mmol/L 3.1(L) 4.3 4.4  Chloride 98 - 111 mmol/L 99 - 98  CO2 22 - 32 mmol/L 26 - 22  Calcium 8.9 - 10.3 mg/dL 7.8(L) - 8.8(L)  Total Protein 6.5 - 8.1 g/dL 6.3(L) - 6.6  Total Bilirubin 0.3 - 1.2 mg/dL 1.3(H) - 0.9  Alkaline Phos 38 - 126 U/L 88 - 99  AST 15 - 41 U/L 35 - 38  ALT 0 - 44 U/L 17 - 20   MRSA screen: Negative PTH: 52 (nl) Ferritin:182 (nl) Iron: 23 (low) Hep B panel: negative Discharge Instructions: Discharge Instructions    Diet - low sodium heart healthy   Complete by:  As directed    Discharge instructions   Complete by:  As directed    Please follow up with your new nephrology group here in town.  Our case manager has referred you to a primary care provider in the area who can help get you set up with a cardiologist in [place].   Increase activity slowly   Complete by:  As directed       Signed: Dewayne Hatch, MD 04/06/2018, 1:04 PM   Pager: [phone]

## 2018-04-05 LAB — COMPREHENSIVE METABOLIC PANEL
ALT: 17 U/L (ref 0–44)
AST: 35 U/L (ref 15–41)
Albumin: 2.7 g/dL — ABNORMAL LOW (ref 3.5–5.0)
Alkaline Phosphatase: 88 U/L (ref 38–126)
Anion gap: 11 (ref 5–15)
BUN: 12 mg/dL (ref 8–23)
CO2: 26 mmol/L (ref 22–32)
Calcium: 7.8 mg/dL — ABNORMAL LOW (ref 8.9–10.3)
Chloride: 99 mmol/L (ref 98–111)
Creatinine, Ser: 2.16 mg/dL — ABNORMAL HIGH (ref 0.61–1.24)
GFR calc Af Amer: 33 mL/min — ABNORMAL LOW (ref >60)
GFR calc non Af Amer: 28 mL/min — ABNORMAL LOW (ref >60)
Glucose, Bld: 97 mg/dL (ref 70–99)
Potassium: 3.1 mmol/L — ABNORMAL LOW (ref 3.5–5.1)
Sodium: 136 mmol/L (ref 135–145)
Total Bilirubin: 1.3 mg/dL — ABNORMAL HIGH (ref 0.3–1.2)
Total Protein: 6.3 g/dL — ABNORMAL LOW (ref 6.5–8.1)

## 2018-04-05 LAB — PARATHYROID HORMONE, INTACT (NO CA): PTH: 52 pg/mL (ref 15–65)

## 2018-04-07 ENCOUNTER — Telehealth: Payer: Self-pay

## 2018-04-07 NOTE — Telephone Encounter (Signed)
NOTES ON FILE 

## 2018-05-07 ENCOUNTER — Encounter: Payer: Medicare Other | Admitting: Internal Medicine

## 2018-06-12 ENCOUNTER — Encounter (HOSPITAL_COMMUNITY): Payer: Self-pay

## 2018-06-12 ENCOUNTER — Emergency Department (HOSPITAL_COMMUNITY): Payer: Medicare Other

## 2018-06-12 ENCOUNTER — Other Ambulatory Visit: Payer: Self-pay

## 2018-06-12 ENCOUNTER — Inpatient Hospital Stay (HOSPITAL_COMMUNITY)
Admission: EM | Admit: 2018-06-12 | Discharge: 2018-07-07 | DRG: 871 | Disposition: E | Payer: Medicare Other | Attending: Internal Medicine | Admitting: Internal Medicine

## 2018-06-12 DIAGNOSIS — D6959 Other secondary thrombocytopenia: Secondary | ICD-10-CM | POA: Diagnosis present

## 2018-06-12 DIAGNOSIS — N179 Acute kidney failure, unspecified: Secondary | ICD-10-CM | POA: Diagnosis present

## 2018-06-12 DIAGNOSIS — Z880 Allergy status to penicillin: Secondary | ICD-10-CM

## 2018-06-12 DIAGNOSIS — E872 Acidosis: Secondary | ICD-10-CM | POA: Diagnosis present

## 2018-06-12 DIAGNOSIS — Z20828 Contact with and (suspected) exposure to other viral communicable diseases: Secondary | ICD-10-CM | POA: Diagnosis present

## 2018-06-12 DIAGNOSIS — I120 Hypertensive chronic kidney disease with stage 5 chronic kidney disease or end stage renal disease: Secondary | ICD-10-CM | POA: Diagnosis present

## 2018-06-12 DIAGNOSIS — Z452 Encounter for adjustment and management of vascular access device: Secondary | ICD-10-CM

## 2018-06-12 DIAGNOSIS — N3000 Acute cystitis without hematuria: Secondary | ICD-10-CM

## 2018-06-12 DIAGNOSIS — E162 Hypoglycemia, unspecified: Secondary | ICD-10-CM | POA: Diagnosis present

## 2018-06-12 DIAGNOSIS — A419 Sepsis, unspecified organism: Principal | ICD-10-CM | POA: Diagnosis present

## 2018-06-12 DIAGNOSIS — E871 Hypo-osmolality and hyponatremia: Secondary | ICD-10-CM | POA: Diagnosis present

## 2018-06-12 DIAGNOSIS — Z515 Encounter for palliative care: Secondary | ICD-10-CM | POA: Diagnosis not present

## 2018-06-12 DIAGNOSIS — Z79899 Other long term (current) drug therapy: Secondary | ICD-10-CM

## 2018-06-12 DIAGNOSIS — N186 End stage renal disease: Secondary | ICD-10-CM | POA: Diagnosis present

## 2018-06-12 DIAGNOSIS — Z9181 History of falling: Secondary | ICD-10-CM

## 2018-06-12 DIAGNOSIS — J9601 Acute respiratory failure with hypoxia: Secondary | ICD-10-CM | POA: Diagnosis present

## 2018-06-12 DIAGNOSIS — J90 Pleural effusion, not elsewhere classified: Secondary | ICD-10-CM | POA: Diagnosis present

## 2018-06-12 DIAGNOSIS — R34 Anuria and oliguria: Secondary | ICD-10-CM | POA: Diagnosis present

## 2018-06-12 DIAGNOSIS — J9600 Acute respiratory failure, unspecified whether with hypoxia or hypercapnia: Secondary | ICD-10-CM

## 2018-06-12 DIAGNOSIS — Z85038 Personal history of other malignant neoplasm of large intestine: Secondary | ICD-10-CM

## 2018-06-12 DIAGNOSIS — Z66 Do not resuscitate: Secondary | ICD-10-CM | POA: Diagnosis not present

## 2018-06-12 DIAGNOSIS — I4891 Unspecified atrial fibrillation: Secondary | ICD-10-CM | POA: Diagnosis present

## 2018-06-12 DIAGNOSIS — Z951 Presence of aortocoronary bypass graft: Secondary | ICD-10-CM

## 2018-06-12 DIAGNOSIS — Z950 Presence of cardiac pacemaker: Secondary | ICD-10-CM

## 2018-06-12 DIAGNOSIS — R578 Other shock: Secondary | ICD-10-CM | POA: Diagnosis present

## 2018-06-12 DIAGNOSIS — E876 Hypokalemia: Secondary | ICD-10-CM | POA: Diagnosis present

## 2018-06-12 DIAGNOSIS — Z7901 Long term (current) use of anticoagulants: Secondary | ICD-10-CM

## 2018-06-12 DIAGNOSIS — R52 Pain, unspecified: Secondary | ICD-10-CM

## 2018-06-12 DIAGNOSIS — I70262 Atherosclerosis of native arteries of extremities with gangrene, left leg: Secondary | ICD-10-CM | POA: Diagnosis present

## 2018-06-12 DIAGNOSIS — L03116 Cellulitis of left lower limb: Secondary | ICD-10-CM | POA: Diagnosis present

## 2018-06-12 DIAGNOSIS — R6521 Severe sepsis with septic shock: Secondary | ICD-10-CM | POA: Diagnosis present

## 2018-06-12 DIAGNOSIS — I2510 Atherosclerotic heart disease of native coronary artery without angina pectoris: Secondary | ICD-10-CM | POA: Diagnosis present

## 2018-06-12 DIAGNOSIS — Z7982 Long term (current) use of aspirin: Secondary | ICD-10-CM

## 2018-06-12 DIAGNOSIS — Z6825 Body mass index (BMI) 25.0-25.9, adult: Secondary | ICD-10-CM

## 2018-06-12 DIAGNOSIS — K7290 Hepatic failure, unspecified without coma: Secondary | ICD-10-CM | POA: Diagnosis present

## 2018-06-12 DIAGNOSIS — K7030 Alcoholic cirrhosis of liver without ascites: Secondary | ICD-10-CM | POA: Diagnosis present

## 2018-06-12 DIAGNOSIS — R64 Cachexia: Secondary | ICD-10-CM | POA: Diagnosis present

## 2018-06-12 DIAGNOSIS — F05 Delirium due to known physiological condition: Secondary | ICD-10-CM | POA: Diagnosis not present

## 2018-06-12 DIAGNOSIS — Z992 Dependence on renal dialysis: Secondary | ICD-10-CM

## 2018-06-12 HISTORY — DX: Unspecified kidney failure: N19

## 2018-06-12 LAB — MRSA PCR SCREENING: MRSA by PCR: NEGATIVE

## 2018-06-12 LAB — CBC WITH DIFFERENTIAL/PLATELET
Abs Immature Granulocytes: 0.65 10*3/uL — ABNORMAL HIGH (ref 0.00–0.07)
Basophils Absolute: 0 10*3/uL (ref 0.0–0.1)
Basophils Relative: 1 %
Eosinophils Absolute: 0.1 10*3/uL (ref 0.0–0.5)
Eosinophils Relative: 3 %
HCT: 43 % (ref 39.0–52.0)
Hemoglobin: 13.2 g/dL (ref 13.0–17.0)
Immature Granulocytes: 18 %
Lymphocytes Relative: 23 %
Lymphs Abs: 0.9 10*3/uL (ref 0.7–4.0)
MCH: 25.9 pg — ABNORMAL LOW (ref 26.0–34.0)
MCHC: 30.7 g/dL (ref 30.0–36.0)
MCV: 84.3 fL (ref 80.0–100.0)
Monocytes Absolute: 0.2 10*3/uL (ref 0.1–1.0)
Monocytes Relative: 6 %
Neutro Abs: 1.8 10*3/uL (ref 1.7–7.7)
Neutrophils Relative %: 49 %
Platelets: 51 10*3/uL — ABNORMAL LOW (ref 150–400)
RBC: 5.1 MIL/uL (ref 4.22–5.81)
RDW: 21.6 % — ABNORMAL HIGH (ref 11.5–15.5)
WBC Morphology: INCREASED
WBC: 3.7 10*3/uL — ABNORMAL LOW (ref 4.0–10.5)
nRBC: 0 % (ref 0.0–0.2)

## 2018-06-12 LAB — COMPREHENSIVE METABOLIC PANEL
ALT: 37 U/L (ref 0–44)
AST: 65 U/L — ABNORMAL HIGH (ref 15–41)
Albumin: 2.7 g/dL — ABNORMAL LOW (ref 3.5–5.0)
Alkaline Phosphatase: 65 U/L (ref 38–126)
Anion gap: 16 — ABNORMAL HIGH (ref 5–15)
BUN: 24 mg/dL — ABNORMAL HIGH (ref 8–23)
CO2: 19 mmol/L — ABNORMAL LOW (ref 22–32)
Calcium: 8.8 mg/dL — ABNORMAL LOW (ref 8.9–10.3)
Chloride: 98 mmol/L (ref 98–111)
Creatinine, Ser: 4 mg/dL — ABNORMAL HIGH (ref 0.61–1.24)
GFR calc Af Amer: 16 mL/min — ABNORMAL LOW (ref >60)
GFR calc non Af Amer: 14 mL/min — ABNORMAL LOW (ref >60)
Glucose, Bld: 69 mg/dL — ABNORMAL LOW (ref 70–99)
Potassium: 3.3 mmol/L — ABNORMAL LOW (ref 3.5–5.1)
Sodium: 133 mmol/L — ABNORMAL LOW (ref 135–145)
Total Bilirubin: 1.9 mg/dL — ABNORMAL HIGH (ref 0.3–1.2)
Total Protein: 6.1 g/dL — ABNORMAL LOW (ref 6.5–8.1)

## 2018-06-12 LAB — URINALYSIS, MICROSCOPIC (REFLEX)

## 2018-06-12 LAB — URINALYSIS, ROUTINE W REFLEX MICROSCOPIC
Glucose, UA: 100 mg/dL — AB
Ketones, ur: 15 mg/dL — AB
Nitrite: POSITIVE — AB
Protein, ur: 100 mg/dL — AB
Specific Gravity, Urine: 1.025 (ref 1.005–1.030)
pH: 6.5 (ref 5.0–8.0)

## 2018-06-12 LAB — GLUCOSE, CAPILLARY
Glucose-Capillary: 61 mg/dL — ABNORMAL LOW (ref 70–99)
Glucose-Capillary: 113 mg/dL — ABNORMAL HIGH (ref 70–99)
Glucose-Capillary: 51 mg/dL — ABNORMAL LOW (ref 70–99)

## 2018-06-12 LAB — LACTIC ACID, PLASMA
Lactic Acid, Venous: 5.3 mmol/L (ref 0.5–1.9)
Lactic Acid, Venous: 4.4 mmol/L (ref 0.5–1.9)
Lactic Acid, Venous: 4.1 mmol/L (ref 0.5–1.9)

## 2018-06-12 LAB — PROCALCITONIN: Procalcitonin: 26.25 ng/mL

## 2018-06-12 LAB — SARS CORONAVIRUS 2 BY RT PCR (HOSPITAL ORDER, PERFORMED IN ~~LOC~~ HOSPITAL LAB): SARS Coronavirus 2: NEGATIVE

## 2018-06-12 MED ORDER — ATORVASTATIN CALCIUM 80 MG PO TABS
80.0000 mg | ORAL_TABLET | Freq: Every day | ORAL | Status: DC
  Filled 2018-06-12 (×2): qty 1

## 2018-06-12 MED ORDER — SODIUM CHLORIDE 0.9 % IV SOLN
2.0000 g | Freq: Once | INTRAVENOUS | Status: AC
  Administered 2018-06-12: 2 g via INTRAVENOUS
  Filled 2018-06-12: qty 2

## 2018-06-12 MED ORDER — SODIUM CHLORIDE 0.9 % IV SOLN
INTRAVENOUS | Status: DC
  Administered 2018-06-12: 16:00:00 via INTRAVENOUS

## 2018-06-12 MED ORDER — LACTULOSE 10 GM/15ML PO SOLN
3.4000 g | Freq: Two times a day (BID) | ORAL | Status: DC
  Administered 2018-06-13 – 2018-06-15 (×3): 3.4 g via ORAL
  Filled 2018-06-12 (×8): qty 15

## 2018-06-12 MED ORDER — NOREPINEPHRINE 4 MG/250ML-% IV SOLN
INTRAVENOUS | Status: AC
  Administered 2018-06-12: 5 ug/min via INTRAVENOUS
  Filled 2018-06-12: qty 250

## 2018-06-12 MED ORDER — DEXTROSE 5 % IV SOLN
0.5000 g | Freq: Two times a day (BID) | INTRAVENOUS | Status: DC
  Administered 2018-06-13: 0.5 g via INTRAVENOUS
  Filled 2018-06-12 (×3): qty 0.5

## 2018-06-12 MED ORDER — NOREPINEPHRINE 4 MG/250ML-% IV SOLN: 0.0000 ug/min | INTRAVENOUS | Status: DC

## 2018-06-12 MED ORDER — ASPIRIN 81 MG PO CHEW
81.0000 mg | CHEWABLE_TABLET | Freq: Every day | ORAL | Status: DC
  Administered 2018-06-13 – 2018-06-15 (×3): 81 mg via ORAL
  Filled 2018-06-12 (×3): qty 1

## 2018-06-12 MED ORDER — HEPARIN SODIUM (PORCINE) 5000 UNIT/ML IJ SOLN: 5000.0000 [IU] | Freq: Three times a day (TID) | INTRAMUSCULAR | Status: DC

## 2018-06-12 MED ORDER — NOREPINEPHRINE 4 MG/250ML-% IV SOLN
0.0000 ug/min | INTRAVENOUS | Status: DC
  Administered 2018-06-12: 5 ug/min via INTRAVENOUS
  Administered 2018-06-12: 8 ug/min via INTRAVENOUS
  Administered 2018-06-13 (×2): 15 ug/min via INTRAVENOUS
  Administered 2018-06-13: 8 ug/min via INTRAVENOUS
  Administered 2018-06-13: 15 ug/min via INTRAVENOUS
  Administered 2018-06-14 (×2): 14 ug/min via INTRAVENOUS
  Administered 2018-06-14: 10 ug/min via INTRAVENOUS
  Administered 2018-06-14: 17 ug/min via INTRAVENOUS
  Administered 2018-06-15: 11:00:00 20 ug/min via INTRAVENOUS
  Administered 2018-06-15: 16.5 ug/min via INTRAVENOUS
  Administered 2018-06-15: 17 ug/min via INTRAVENOUS
  Administered 2018-06-15: 14:00:00 26 ug/min via INTRAVENOUS
  Filled 2018-06-12 (×13): qty 250

## 2018-06-12 MED ORDER — THIAMINE HCL 100 MG/ML IJ SOLN
100.0000 mg | Freq: Every day | INTRAMUSCULAR | Status: DC
  Administered 2018-06-12 – 2018-06-15 (×4): 100 mg via INTRAVENOUS
  Filled 2018-06-12 (×4): qty 2

## 2018-06-12 MED ORDER — AMIODARONE IV BOLUS ONLY 150 MG/100ML
150.0000 mg | Freq: Once | INTRAVENOUS | Status: AC
  Administered 2018-06-12: 22:00:00 150 mg via INTRAVENOUS
  Filled 2018-06-12: qty 100

## 2018-06-12 MED ORDER — ORAL CARE MOUTH RINSE
15.0000 mL | Freq: Two times a day (BID) | OROMUCOSAL | Status: DC
  Administered 2018-06-12 – 2018-06-13 (×2): 15 mL via OROMUCOSAL

## 2018-06-12 MED ORDER — DEXTROSE 50 % IV SOLN
INTRAVENOUS | Status: AC
  Administered 2018-06-12: 22:00:00 25 mL
  Filled 2018-06-12: qty 50

## 2018-06-12 MED ORDER — VANCOMYCIN HCL 10 G IV SOLR
1500.0000 mg | Freq: Once | INTRAVENOUS | Status: AC
  Administered 2018-06-12: 1500 mg via INTRAVENOUS
  Filled 2018-06-12: qty 1500

## 2018-06-12 MED ORDER — OFF THE BEAT BOOK
Freq: Once | Status: AC
  Administered 2018-06-13: 11:00:00
  Filled 2018-06-12: qty 1

## 2018-06-12 MED ORDER — FOLIC ACID 5 MG/ML IJ SOLN
1.0000 mg | Freq: Every day | INTRAMUSCULAR | Status: DC
  Administered 2018-06-13 – 2018-06-15 (×3): 1 mg via INTRAVENOUS
  Filled 2018-06-12 (×4): qty 0.2

## 2018-06-12 MED ORDER — LACTATED RINGERS IV BOLUS
1250.0000 mL | Freq: Once | INTRAVENOUS | Status: AC
  Administered 2018-06-12: 1250 mL via INTRAVENOUS

## 2018-06-12 NOTE — Progress Notes (Signed)
Pharmacy Antibiotic Note  Larry Fitzgerald is a 78 y.o. male admitted on 06/21/2018 with sepsis.  Pharmacy has been consulted for vancomycin/aztreonam dosing.  Presenting with cellulitis of R shin s/p fall - increasing redness, pain, and warmth. Hx of ESRD. WBC 3.7, LA 5.3, temp 97 on arrival.   Has documented hx of anaphylaxis to PCN - no records of receiving cephalosporins.   Plan: Order vancomycin 1500 mg IV once. Will order vancomycin 750 mg IV post-HD once HD schedule determined. Order aztreonam 2g IV once then will start 500 mg IV every 12 hours. Monitor cx results, clinical pic, and HD schedule.  Weight: 154 lb 5.2 oz (70 kg)  Temp (24hrs), Avg:97 F (36.1 C), Min:97 F (36.1 C), Max:97 F (36.1 C)  Recent Labs  Lab 06/14/2018 1246  WBC 3.7*  CREATININE 4.00*  LATICACIDVEN 5.3*    Estimated Creatinine Clearance: 14.5 mL/min (A) (by C-G formula based on SCr of 4 mg/dL (H)).    Allergies  Allergen Reactions  . Penicillins Anaphylaxis    Did it involve swelling oYesf the face/tongue/throat, SOB, or low BP? Yes Did it involve sudden or severe rash/hives, skin peeling, or any reaction on the inside of your mouth or nose?  Did you need to seek medical attention at a hospital or doctor's office? Yes When did it last happen? If all above answers are "NO", may proceed with cephalosporin use.    Antimicrobials this admission: Vancomycin 5/7 >>  Aztreonam 5/7 >>   Dose adjustments this admission: N/A  Microbiology results: 5/7 BCx: sent 5/7 UCx: sent  5/7 COVID PCR: sent   Thank you for allowing pharmacy to be a part of this patient's care.  Antonietta Jewel, PharmD, [place] Clinical Pharmacist  Pager: [phone] Phone: [phone] 06/10/2018 1:56 PM

## 2018-06-12 NOTE — Progress Notes (Signed)
[facility] Progress Note Patient Name: Larry Fitzgerald DOB: Aug 27, 1940 MRN: [id number]   Date of Service  07/01/2018  HPI/Events of Note  Afib with RVR, relative hypotension.  eICU Interventions  Amiodarone 150 mg iv bolus x 1        Okoronkwo U Ogan 06/11/2018, 9:54 PM

## 2018-06-12 NOTE — H&P (Signed)
NAME:  Larry Fitzgerald, MRN:  [id number], DOB:  1940/08/05, LOS: 0 ADMISSION DATE:  06/19/2018, CONSULTATION DATE:  06/28/2018 REFERRING MD:  Ronnald Nian  CHIEF COMPLAINT:  Falls   Brief History   78 y/o M who presented to [facility] on 5/7 with reports of fall and concern for cellulitis of right chin. On presentation he was noted to be hypotensive with systolic in the 49'Z, in [facility].  Concern for sepsis with possible cellulitis source vs urinary.   Of note, baseline BP is high 90s to low 100s.  History of present illness   The patient is a poor historian.  Information obtained from daughter, staff and prior medical documentation.  At baseline, he is from [place] but currently lives with his daughter.  He had bypass surgery in January. After that surgery he had to have a pacemaker placed. He had renal failure during that admit and now is on HD (T/Th/S).  He previously had high blood pressure before heart surgery and now runs with a systolic of 791 typically.  The patient fell approximately 4 weeks prior to admit.  Normally is independent of ADL's.  Daughter had been managing wounds at home and it was getting better.  Daughter reports she thought it had healed and then he showed her the wound two days before admit and it looked worse.  He reported pain in his right lower leg.  No fevers, chills, n/v/d.  Since heart surgery, he "breathes differently" but is not worse than normal.  Patient more confused the am of admit.  They had a video call with MD on 5/6 for RLE and he was given bactrim and had taken 3 doses.  Reports he has poor PO intake.  Daughter reports he weighs 142-146 and is stable.    78 y/o M who presented to [facility] on 5/7 with reports of fall and concern for cellulitis of left shin. EMS report noted he had fallen several weeks ago and injured his left shin.  His family had been attempting to care for it at home but he had increasing redness, pain and warmth.  He reportedly fell again the evening prior to  presentation.    On presentation he was noted to be hypotensive with systolic in the 50'V, in [facility] with a normal mental status per staff.  UA worrisome for possible UTI but patient is anuric. Labs notable for Na 133, K 3.3, Cl 98, CO2 19, glucose 69, BUN 24, Sr Cr 4, AG 16, AST 65 / ALT 37, WBC 3.7, Hgb 13.2 and platelets 51. CXR notable for bilateral pleural effusions.  Of note, baseline BP is high 90s to low 100s.  Past Medical History  ESRD on HD TTS, cirrhosis, CAD s/p CABG, PPM, colon CA.  Significant Hospital Events   5/7 > admit  Consults:  Nephrology.  Procedures:  None.  Significant Diagnostic Tests:  CXR 5/7 > bibasilar atelectasis. Left foot/tib/fib 5/7 > soft tissue swelling.  Micro Data:  Blood 5/7 >  Urine 5/7 >   Antimicrobials:  Vanc 5/7 >  Aztreonam 5/7 >    Interim history/subjective:  Comfortable on NRB.  "I feel fine".  Objective:  Blood pressure 121/68, pulse (!) 126, temperature (!) 97 F (36.1 C), temperature source Rectal, resp. rate (!) 22, weight 70 kg, SpO2 (!) 88 %.        Intake/Output Summary (Last 24 hours) at 07/05/2018 1421 Last data filed at 06/17/2018 1405 Gross per 24 hour  Intake 100 ml  Output -  Net 100 ml   Filed Weights   06/07/2018 1236  Weight: 70 kg    Examination: General: Elderly appearing male, resting in bed, in NAD. Neuro: Awake, answers questions appropriately, follows basic commands. HEENT: /AT. Sclerae anicteric, EOMI. Cardiovascular: RRR, no M/R/G.  Lungs: Respirations even and unlabored.  Faint rhonchi in bases. Abdomen: BS x 4, soft, NT/ND.  Musculoskeletal: LLE cellulitis from shin up to mid thigh.  No gross deformities, 1+ edema.  Skin: LLE cellulitis as above, skin otherwise warm.   Assessment & Plan:   Acute hypoxic respiratory failure - unclear etiology at this point.  Suspect poor waveform / pleth on pulse oximeter given normal O2 on VBG (pO2 73, O2 sat 96). - Continue supplemental O2 as needed  to maintain SpO2 > 92%. - Bronchial hygiene.  Shock - septic due to UTI and probable LLE cellulitis.  Of note, per pt his normal BP runs high 90's to low 100's. - Continue levophed for goal MAP > 65. - Continue empiric vanc / aztreonam. - Follow cultures. - Might need to consider CT of LLE if no improvement or lactate remains elevated or starts to rise.  ESRD. Hyponatremia. Hypokalemia. AGMA - lactate + renal failure. - Will notify Nephrology of admission and plan for normal schedule (will need to decide HD vs CRRT). - Trend lactate. - Follow BMP.  Thrombocytopenia - unclear etiology, possibly due to sepsis. - Hold VTE chemoprophylaxis. - Follow platelet counts.  Recent falls. - PT consult.  Hx CAD s/p CABG. - Continue preadmission ASA, lipitor. - Hold preadmission furosemide.  Hx EtOH cirrhosis. - Continue preadmission lactulose. - Thiamine / folate.   Best Practice:  Diet: NPO. Pain/Anxiety/Delirium protocol (if indicated): N/A. VAP protocol (if indicated): None. DVT prophylaxis: SCD RLE only.  GI prophylaxis: None. Glucose control: None. Mobility: Bedrest. Code Status: Limited (no CPR). Family Communication: Daughter updated over the phone. Disposition: ICU.  Labs   CBC: Recent Labs  Lab 06/11/2018 1246  WBC 3.7*  NEUTROABS 1.8  HGB 13.2  HCT 43.0  MCV 84.3  PLT 51*   Basic Metabolic Panel: Recent Labs  Lab 06/10/2018 1246  NA 133*  K 3.3*  CL 98  CO2 19*  GLUCOSE 69*  BUN 24*  CREATININE 4.00*  CALCIUM 8.8*   GFR: Estimated Creatinine Clearance: 14.5 mL/min (A) (by C-G formula based on SCr of 4 mg/dL (H)). Recent Labs  Lab 06/16/2018 1246  PROCALCITON 26.25  WBC 3.7*  LATICACIDVEN 5.3*   Liver Function Tests: Recent Labs  Lab 06/20/2018 1246  AST 65*  ALT 37  ALKPHOS 65  BILITOT 1.9*  PROT 6.1*  ALBUMIN 2.7*   No results for input(s): LIPASE, AMYLASE in the last 168 hours. No results for input(s): AMMONIA in the last 168 hours.  ABG    Component Value Date/Time   HCO3 25.5 04/03/2018 1052   TCO2 27 04/03/2018 1052   O2SAT 96.0 04/03/2018 1052    Coagulation Profile: No results for input(s): INR, PROTIME in the last 168 hours. Cardiac Enzymes: No results for input(s): CKTOTAL, CKMB, CKMBINDEX, TROPONINI in the last 168 hours. HbA1C: No results found for: HGBA1C CBG: No results for input(s): GLUCAP in the last 168 hours.  Review of Systems:   All negative; except for those that are bolded, which indicate positives.  Constitutional: weight loss, weight gain, night sweats, fevers, chills, fatigue, weakness.  HEENT: headaches, sore throat, sneezing, nasal congestion, post nasal drip, difficulty swallowing, tooth/dental problems, visual complaints, visual changes, ear  aches. Neuro: difficulty with speech, weakness, numbness, ataxia. CV:  chest pain, orthopnea, PND, swelling in lower extremities, dizziness, palpitations, syncope.  Resp: cough, hemoptysis, dyspnea, wheezing. GI: heartburn, indigestion, abdominal pain, nausea, vomiting, diarrhea, constipation, change in bowel habits, loss of appetite, hematemesis, melena, hematochezia.  GU: dysuria, change in color of urine, urgency or frequency, flank pain, hematuria. MSK: joint pain or swelling, decreased range of motion. Psych: change in mood or affect, depression, anxiety, suicidal ideations, homicidal ideations. Skin: rash, itching, bruising.  Past medical history  He,  has a past medical history of Cancer [facility]), Coronary artery disease, Liver cirrhosis ([place]), and Renal failure.   Surgical History    Past Surgical History:  Procedure Laterality Date  . CARDIAC SURGERY     CABG   . COLON SURGERY       Social History   reports that he has never smoked. He has never used smokeless tobacco. He reports previous alcohol use. He reports that he does not use drugs.   Family history   His family history is not on file.   Allergies Allergies  Allergen  Reactions  . Penicillins Anaphylaxis    Did it involve swelling oYesf the face/tongue/throat, SOB, or low BP? Yes Did it involve sudden or severe rash/hives, skin peeling, or any reaction on the inside of your mouth or nose?  Did you need to seek medical attention at a hospital or doctor's office? Yes When did it last happen? If all above answers are "NO", may proceed with cephalosporin use.     Home meds  Prior to Admission medications   Medication Sig Start Date End Date Taking? Authorizing Provider  aspirin EC 81 MG tablet Take 81 mg by mouth daily.   Yes [provider]  atorvastatin (LIPITOR) 80 MG tablet Take 80 mg by mouth daily. 02/19/18  Yes [provider]  citalopram (CELEXA) 20 MG tablet Take 20 mg by mouth daily. 03/13/18 07/03/2018 Yes [provider]  furosemide (LASIX) 40 MG tablet Take 40 mg by mouth daily. 12/17/17  Yes [provider]  lactulose (CHRONULAC) 10 GM/15ML solution Take 20 g by mouth 2 (two) times daily. 5 ml twice daily 08/30/17  Yes [provider]  metoprolol tartrate (LOPRESSOR) 25 MG tablet Take 25 mg by mouth 2 (two) times daily. 03/13/18 06/27/2018 Yes [provider]  mirtazapine (REMERON) 15 MG tablet Take 7.5 mg by mouth at bedtime.   Yes [provider]  sulfamethoxazole-trimethoprim (BACTRIM DS) 800-160 MG tablet Take 1 tablet by mouth 2 (two) times a day. 06/11/18 06/21/18 Yes [provider]  Misc Natural Products (GLUCOS-CHONDROIT-MSM COMPLEX PO) Take 1 capsule by mouth daily.    [provider]  Multiple Vitamins-Minerals (EQ MULTIVITAMINS ADULT GUMMY) CHEW Chew 1 tablet by mouth daily.    [provider]  omeprazole (PRILOSEC) 20 MG capsule Take 20 mg by mouth daily. 01/20/16   [provider]    Critical care time: 40 min.   [name], [facility] Pager: [phone].  If no answer, [phone] 06/26/2018,  3:26 PM

## 2018-06-12 NOTE — ED Notes (Signed)
Pt remains 88-92% on 15LPM on NRB. MD Curatolo remains aware of increasing O2 requirement. At this time, pt mentating, acutely responsive. Tachycardic and tachypneic

## 2018-06-12 NOTE — ED Notes (Signed)
Cassie([facility] called/Activate Code stroke) @ 1236-per Dr. Wynne Dust by Levada Dy

## 2018-06-12 NOTE — ED Notes (Signed)
Code Stroke Cancel-Cassie([facility] called/Activate Code stroke) @ 1236-per Dr. Wynne Dust by Levada Dy

## 2018-06-12 NOTE — Progress Notes (Signed)
Hypoglycemic Event  CBG: 51  Treatment: 3mL D50 Symptoms: Lethargic  Follow-up CBG: Time:2214 CBG Result:113  Possible Reasons for Event: Pt NPO     Larry Fitzgerald

## 2018-06-12 NOTE — Progress Notes (Signed)
Pt continues to be in Afib with rate 130-160's. Elink paged. MD ordered Amio bolus 150mg .

## 2018-06-12 NOTE — ED Provider Notes (Signed)
[facility] Provider Note   CSN: [id number] Arrival date & time: 07/05/2018  1227    History   Chief Complaint Chief Complaint  Patient presents with  . Cellulitis    HPI [facility] is a 78 y.o. male.     The history is provided by the patient.  Leg Pain  Location:  Leg Injury: no   Leg location:  L leg Pain details:    Quality:  Dull, aching and throbbing   Severity:  Mild   Onset quality:  Gradual   Duration:  5 days   Timing:  Constant   Progression:  Worsening Chronicity:  New Relieved by:  Nothing Worsened by:  Nothing Ineffective treatments:  None tried Associated symptoms: swelling (redness and concern by PCP for cellulitis/sepsis)   Associated symptoms: no back pain, no decreased ROM, no fatigue, no fever, no itching, no muscle weakness, no neck pain, no numbness and no tingling     Past Medical History:  Diagnosis Date  . Cancer ([place])    colon ca   . Coronary artery disease   . Liver cirrhosis ([place])   . Renal failure     Patient Active Problem List   Diagnosis Date Noted  . Septic shock ([place]) 06/13/2018  . Cellulitis of left lower extremity   . ESRD (end stage renal disease) ([place]) 04/03/2018    Past Surgical History:  Procedure Laterality Date  . CARDIAC SURGERY     CABG   . COLON SURGERY          Home Medications    Prior to Admission medications   Medication Sig Start Date End Date Taking? Authorizing Provider  aspirin EC 81 MG tablet Take 81 mg by mouth daily.   Yes [provider]  atorvastatin (LIPITOR) 80 MG tablet Take 80 mg by mouth daily. 02/19/18  Yes [provider]  citalopram (CELEXA) 20 MG tablet Take 20 mg by mouth daily. 03/13/18 06/25/2018 Yes [provider]  furosemide (LASIX) 40 MG tablet Take 40 mg by mouth daily. 12/17/17  Yes [provider]  lactulose (CHRONULAC) 10 GM/15ML solution Take 3.3 g by mouth 2 (two) times daily. 5 ml twice daily  08/30/17  Yes [provider]  metoprolol tartrate (LOPRESSOR) 25 MG tablet Take 25 mg by mouth 2 (two) times daily. 03/13/18 06/19/2018 Yes [provider]  mirtazapine (REMERON) 15 MG tablet Take 7.5 mg by mouth at bedtime.   Yes [provider]  Misc Natural Products (GLUCOS-CHONDROIT-MSM COMPLEX PO) Take 1 capsule by mouth daily.   Yes [provider]  Multiple Vitamins-Minerals (EQ MULTIVITAMINS ADULT GUMMY) CHEW Chew 1 tablet by mouth daily.   Yes [provider]  omeprazole (PRILOSEC) 20 MG capsule Take 20 mg by mouth daily. 01/20/16  Yes [provider]  sulfamethoxazole-trimethoprim (BACTRIM DS) 800-160 MG tablet Take 1 tablet by mouth 2 (two) times a day. 06/11/18 06/21/18 Yes [provider]    Family History History reviewed. No pertinent family history.  Social History Social History   Tobacco Use  . Smoking status: Never Smoker  . Smokeless tobacco: Never Used  Substance Use Topics  . 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 Drug use: Never     Allergies   Penicillins   Review of Systems Review of Systems  Constitutional: Negative for chills, fatigue and fever.  HENT: Negative for ear  pain and sore throat.   Eyes: Negative for pain and visual disturbance.  Respiratory: Negative for cough and shortness of breath.   Cardiovascular: Positive for leg swelling. Negative for chest pain and palpitations.  Gastrointestinal: Negative for abdominal pain and vomiting.  Genitourinary: Negative for dysuria and hematuria.  Musculoskeletal: Negative for arthralgias, back pain and neck pain.  Skin: Positive for color change and wound. Negative for itching and rash.  Neurological: Negative for seizures and syncope.  All other systems reviewed and are negative.    Physical Exam Updated Vital Signs  ED Triage Vitals  Enc Vitals Group     BP 06/11/2018 1230 (!) 64/48     Pulse  Rate 06/30/2018 1241 (!) 122     Resp 06/21/2018 1240 (!) 26     Temp 06/21/2018 1241 (!) 97 F (36.1 C)     Temp Source 06/11/2018 1241 Rectal     SpO2 06/27/2018 1228 94 %     Weight 06/07/2018 1236 154 lb 5.2 oz (70 kg)     Height --      Head Circumference --      Peak Flow --      Pain Score 06/16/2018 1235 9     Pain Loc --      Pain Edu? --      Excl. in [place]? --     Physical Exam Vitals signs and nursing note reviewed.  Constitutional:      General: He is in acute distress.     Appearance: He is well-developed. He is ill-appearing.  HENT:     Head: Normocephalic and atraumatic.     Nose: Nose normal.     Mouth/Throat:     Mouth: Mucous membranes are dry.  Eyes:     Extraocular Movements: Extraocular movements intact.     Conjunctiva/sclera: Conjunctivae normal.     Pupils: Pupils are equal, round, and reactive to light.  Neck:     Musculoskeletal: Neck supple.  Cardiovascular:     Rate and Rhythm: Tachycardia present. Rhythm irregular.     Pulses: Normal pulses.     Heart sounds: Normal heart sounds. No murmur.  Pulmonary:     Effort: No respiratory distress.     Breath sounds: Rhonchi present. No wheezing or rales.  Abdominal:     General: Abdomen is flat. There is no distension.     Palpations: Abdomen is soft.     Tenderness: There is no abdominal tenderness.  Musculoskeletal:        General: Tenderness (LLE) present.     Right lower leg: No edema.     Left lower leg: Edema present.  Skin:    General: Skin is warm and dry.     Capillary Refill: Capillary refill takes less than 2 seconds.     Findings: Erythema (LLE with redness from foot to midshin with red streaking up into the left groin, no crepitus, ulcerated skin in middle of redness on shin) and rash present.  Neurological:     General: No focal deficit present.     Mental Status: He is alert and oriented to person, place, and time.     Cranial Nerves: No cranial nerve deficit.     Sensory: No sensory deficit.   Psychiatric:        Mood and Affect: Mood normal.      ED Treatments / Results  Labs (all labs ordered are listed, but only abnormal results are displayed) Labs Reviewed  LACTIC ACID,  PLASMA - Abnormal; Notable for the following components:      Result Value   Lactic Acid, Venous 5.3 (*)    All other components within normal limits  COMPREHENSIVE METABOLIC PANEL - Abnormal; Notable for the following components:   Sodium 133 (*)    Potassium 3.3 (*)    CO2 19 (*)    Glucose, Bld 69 (*)    BUN 24 (*)    Creatinine, Ser 4.00 (*)    Calcium 8.8 (*)    Total Protein 6.1 (*)    Albumin 2.7 (*)    AST 65 (*)    Total Bilirubin 1.9 (*)    GFR calc non Af Amer 14 (*)    GFR calc Af Amer 16 (*)    Anion gap 16 (*)    All other components within normal limits  CBC WITH DIFFERENTIAL/PLATELET - Abnormal; Notable for the following components:   WBC 3.7 (*)    MCH 25.9 (*)    RDW 21.6 (*)    Platelets 51 (*)    Abs Immature Granulocytes 0.65 (*)    All other components within normal limits  URINALYSIS, ROUTINE W REFLEX MICROSCOPIC - Abnormal; Notable for the following components:   Color, Urine AMBER (*)    Glucose, UA 100 (*)    Hgb urine dipstick MODERATE (*)    Bilirubin Urine MODERATE (*)    Ketones, ur 15 (*)    Protein, ur 100 (*)    Nitrite POSITIVE (*)    Leukocytes,Ua TRACE (*)    All other components within normal limits  LACTIC ACID, PLASMA - Abnormal; Notable for the following components:   Lactic Acid, Venous 4.4 (*)    All other components within normal limits  URINALYSIS, MICROSCOPIC (REFLEX) - Abnormal; Notable for the following components:   Bacteria, UA MANY (*)    All other components within normal limits  GLUCOSE, CAPILLARY - Abnormal; Notable for the following components:   Glucose-Capillary 61 (*)    All other components within normal limits  SARS CORONAVIRUS 2 (HOSPITAL ORDER, [facility])  CULTURE, BLOOD (ROUTINE X 2)   CULTURE, BLOOD (ROUTINE X 2)  URINE CULTURE  PROCALCITONIN  LACTIC ACID, PLASMA  CBC  BASIC METABOLIC PANEL  MAGNESIUM  PHOSPHORUS    EKG EKG Interpretation  Date/Time:  Thursday Jun 12 2018 14:17:58 EDT Ventricular Rate:  144 PR Interval:    QRS Duration: 107 QT Interval:  306 QTC Calculation: 474 R Axis:   84 Text Interpretation:  Atrial fibrillation with rapid V-rate Ventricular premature complex Borderline right axis deviation Probable LVH with secondary repol abnrm ST depression, probably rate related Confirmed by Lennice Sites [phone]) on 06/06/2018 5:03:16 PM   Radiology Dg Tibia/fibula Left  Result Date: 07/05/2018 CLINICAL DATA:  Possible sepsis. Redness. EXAM: LEFT TIBIA AND FIBULA - 2 VIEW COMPARISON:  None. FINDINGS: There is no evidence of fracture or other focal bone lesions. Marked soft tissue swelling. Vascular calcification. Skeletal osteopenia. IMPRESSION: Marked soft tissue swelling. No fracture. Electronically Signed   By: Staci Righter M.D.   On: 06/25/2018 13:42   Dg Chest Port 1 View  Result Date: 06/21/2018 CLINICAL DATA:  78 year old male with sepsis EXAM: PORTABLE CHEST 1 VIEW COMPARISON:  04/03/2018 FINDINGS: Cardiomediastinal silhouette unchanged with surgical changes of median sternotomy and left atrial clipping. Unchanged right IJ central venous catheter, incompletely imaged. Unchanged left chest wall cardiac pacing device. Low lung volumes. Persisting opacities at the lung bases  with obscuration of the bilateral hemidiaphragm and the bilateral heart borders. No pneumothorax. IMPRESSION: Similar appearance of bibasilar pleural effusions with associated atelectasis/consolidation. Surgical changes of median sternotomy and left atrial clipping. Unchanged right IJ hemodialysis catheter and left chest wall cardiac pacer. Electronically Signed   By: Corrie Mckusick D.O.   On: 06/11/2018 13:37   Dg Foot 2 Views Left  Result Date: 06/16/2018 CLINICAL DATA:   78 year old male with sepsis EXAM: LEFT FOOT - 2 VIEW COMPARISON:  None. FINDINGS: Lateral view demonstrates nonspecific soft tissue swelling on the forefoot, dorsal region. Dense calcifications of the tibial and pedal vessels. No displaced fracture. No subluxation/dislocation. Degenerative changes of the interphalangeal joints, midfoot, and hindfoot. IMPRESSION: Negative for acute bony abnormality. Nonspecific soft tissue swelling of the dorsal forefoot. Tibial and pedal atherosclerosis Electronically Signed   By: Corrie Mckusick D.O.   On: 06/19/2018 13:38    Procedures .Critical Care Performed by: Lennice Sites, DO Authorized by: Lennice Sites, DO   Critical care provider statement:    Critical care time (minutes):  70   Critical care time was exclusive of:  Separately billable procedures and treating other patients and teaching time   Critical care was necessary to treat or prevent imminent or life-threatening deterioration of the following conditions:  Respiratory failure, shock and sepsis   Critical care was time spent personally by me on the following activities:  Blood draw for specimens, development of treatment plan with patient or surrogate, discussions with primary provider, evaluation of patient's response to treatment, examination of patient, obtaining history from patient or surrogate, ordering and performing treatments and interventions, ordering and review of laboratory studies, ordering and review of radiographic studies, pulse oximetry, re-evaluation of patient's condition and review of old charts   I assumed direction of critical care for this patient from another provider in my specialty: no     (including critical care time)  Medications Ordered in ED Medications  norepinephrine (LEVOPHED) 4mg  in 212mL premix infusion (5 mcg/min Intravenous Rate/Dose Change 06/08/2018 1549)  aztreonam (AZACTAM) 0.5 g in dextrose 5 % 50 mL IVPB (has no administration in time range)  aspirin  chewable tablet 81 mg (81 mg Oral Not Given 06/30/2018 1552)  atorvastatin (LIPITOR) tablet 80 mg (has no administration in time range)  lactulose (CHRONULAC) 10 GM/15ML solution 3.4 g (3.4 g Oral Not Given 06/16/2018 1552)  thiamine (B-1) injection 100 mg (has no administration in time range)  folic acid injection 1 mg (1 mg Intravenous Not Given 06/11/2018 1552)  lactated ringers bolus 1,250 mL (1,250 mLs Intravenous New Bag/Given 06/07/2018 1300)  vancomycin (VANCOCIN) 1,500 mg in sodium chloride 0.9 % 500 mL IVPB (1,500 mg Intravenous New Bag/Given 06/27/2018 1417)  aztreonam (AZACTAM) 2 g in sodium chloride 0.9 % 100 mL IVPB (0 g Intravenous Stopped 06/23/2018 1405)     Initial Impression / Assessment and Plan / ED Course  I have reviewed the triage vital signs and the nursing notes.  Pertinent labs & imaging results that were available during my care of the patient were reviewed by me and considered in my medical decision making (see chart for details).     Phill Steck is a 78 year old male with history of CAD, renal failure on hemodialysis who presents to the ED with concern for left lower leg infection.  Patient hypotensive to the 79T systolic upon arrival.  Patient also tachycardic.  EKG shows atrial fibrillation with RVR.  Patient has a history of the same.  Is on  anticoagulation.  Patient with wound to the left lower extremity that occurred after recent fall.  Redness and swelling have slowly progressed over the last several days.  EMS was called to his house yesterday and he refused transportation as they were concerned about cellulitis of his leg.  He had telemetry medicine phone call with his primary care doctor today as redness is now spreading up his left leg into his groin.  Patient has been lightheaded which is likely from his hypotension.  Had dialysis 2 days ago.  He arrives also hypoxic.  Denies any cough or shortness of breath.  He overall appears in no respiratory distress.  Talking in full  sentences.  Denies any cough.  No known coronavirus exposure.  Patient was slowly titrated on oxygen to 15 L nonrebreather and was stable there.  He was given 30 cc/kg of fluids for sepsis.  He was started on IV vancomycin and IV aztreonam for sepsis likely from leg cellulitis.  No obvious crepitus.  X-rays showed diffuse soft tissue swelling but no signs of air of his left lower leg.  At this time have low suspicion for necrotizing fasciitis.  And will hold off on CT imaging at this time.    Patient had an in and out urine that showed possible infection however he is an end-stage renal disease patient.  Will get urine culture as well.  Chest x-ray overall unremarkable.  Coronavirus testing was negative.  Patient remained hypotensive despite fluids and was started on norepinephrine and stabilize his blood pressure on minimal dose of norepi at around 8 mcg.  Respiration stabilized on nonrebreather.  Likely from volume that has been given from fluids.  Would likely need dialysis but hopefully he will avoid any further respiratory compromise.  Critical care was consulted and they came down to the ED to evaluate the patient for admission.  Lactic acid was 5.3.  Otherwise patient with white count of 3.7.  Patient remained hemodynamically stable on norepinephrine and nonrebreather.  Patient appeared to still wish to be full code.  Patient admitted to the ICU team in critical condition but stable.  Patient likely with sepsis from cellulitis.  Possible from UTI.  At this time after discussion with ICU will hold off on any imaging of the lower extremity as we both believe that this is not quite necrotizing fasciitis at this time.  Patient is not a diabetic. Afib with RVR likely from sepsis.   This chart was dictated using voice recognition software.  Despite best efforts to proofread,  errors can occur which can change the documentation meaning.    Final Clinical Impressions(s) / ED Diagnoses   Final diagnoses:   Cellulitis of left lower extremity  Septic shock ([place])  Acute cystitis without hematuria  Atrial fibrillation, unspecified type [facility])    ED Discharge Orders    None       Lennice Sites, DO 06/30/2018 1703

## 2018-06-12 NOTE — Consult Note (Addendum)
[facility] Renal Consultation Note  Requesting MD: Byrum Indication for Consultation:  ESRD  Chief complaint: leg wound  HPI:  [facility] is a 78 y.o. male with a history of end-stage renal disease on HD TTS, cirrhosis, and coronary artery disease who presented to the hospital with septic shock.  This is presumed secondary to cellulitis.  He has a left leg wound which has become erythematous and painful over the past few days.  Blood pressure was 77L systolic initially and now improved to the 90's.  Per report, the patient's baseline blood pressure is in the high 90s.  He received IV fluids and was initiated on levophed and he is admitted to the ICU.  Nursing reports that he has received 2 liters of fluid.  Since getting the fluids he has been able to come down on the levo to 2 mcg/min.  He has been on a non-rebreather but has been comfortable per nursing and they may be able to wean.  Noted that he also had a fib with RVR.  Coronavirus negative.  Note CABG was complicated by dialysis-dependent AKI since 03/01/2018.  He has relocated to [facility] to be closer to family.  PMHx:   Past Medical History:  Diagnosis Date  . Cancer ([place])    colon ca   . Coronary artery disease   . Liver cirrhosis ([place])   . Renal failure     Past Surgical History:  Procedure Laterality Date  . CARDIAC SURGERY     CABG   . COLON SURGERY      Family Hx: History reviewed. No pertinent family history.  Social History:  reports that he has never smoked. He has never used smokeless tobacco. He reports previous alcohol use. He reports that he does not use drugs.  Allergies:  Allergies  Allergen Reactions  . Penicillins Anaphylaxis    Did it involve swelling oYesf the face/tongue/throat, SOB, or low BP? Yes Did it involve sudden or severe rash/hives, skin peeling, or any reaction on the inside of your mouth or nose?  Did you need to seek medical attention at a hospital or doctor's office?  Yes When did it last happen? If all above answers are "NO", may proceed with cephalosporin use.    Medications: Prior to Admission medications   Medication Sig Start Date End Date Taking? Authorizing Provider  aspirin EC 81 MG tablet Take 81 mg by mouth daily.   Yes [provider]  atorvastatin (LIPITOR) 80 MG tablet Take 80 mg by mouth daily. 02/19/18  Yes [provider]  citalopram (CELEXA) 20 MG tablet Take 20 mg by mouth daily. 03/13/18 06/09/2018 Yes [provider]  furosemide (LASIX) 40 MG tablet Take 40 mg by mouth daily. 12/17/17  Yes [provider]  lactulose (CHRONULAC) 10 GM/15ML solution Take 3.3 g by mouth 2 (two) times daily. 5 ml twice daily 08/30/17  Yes [provider]  metoprolol tartrate (LOPRESSOR) 25 MG tablet Take 25 mg by mouth 2 (two) times daily. 03/13/18 06/30/2018 Yes [provider]  mirtazapine (REMERON) 15 MG tablet Take 7.5 mg by mouth at bedtime.   Yes [provider]  Misc Natural Products (GLUCOS-CHONDROIT-MSM COMPLEX PO) Take 1 capsule by mouth daily.   Yes [provider]  Multiple Vitamins-Minerals (EQ MULTIVITAMINS ADULT GUMMY) CHEW Chew 1 tablet by mouth daily.   Yes [provider]  omeprazole (PRILOSEC) 20 MG capsule Take 20 mg by mouth daily. 01/20/16  Yes [provider]  sulfamethoxazole-trimethoprim (BACTRIM DS) 800-160 MG tablet Take 1 tablet by mouth 2 (two) times a day. 06/11/18 06/21/18 Yes [provider]    I have reviewed the patient's current medications.  Labs:  BMP Latest Ref Rng & Units 06/22/2018 04/04/2018 04/03/2018  Glucose 70 - 99 mg/dL 69(L) 97 -  BUN 8 - 23 mg/dL 24(H) 12 -  Creatinine 0.61 - 1.24 mg/dL 4.00(H) 2.16(H) 3.80(H)  Sodium 135 - 145 mmol/L 133(L) 136 133(L)  Potassium 3.5 - 5.1 mmol/L 3.3(L) 3.1(L) 4.3  Chloride 98 - 111 mmol/L 98 99 -  CO2 22 - 32 mmol/L 19(L) 26 -  Calcium 8.9 - 10.3 mg/dL 8.8(L) 7.8(L) -    Urinalysis     Component Value Date/Time   COLORURINE AMBER (A) 06/14/2018 1344   APPEARANCEUR CLEAR 06/06/2018 1344   LABSPEC 1.025 06/11/2018 1344   PHURINE 6.5 07/01/2018 1344   GLUCOSEU 100 (A) 06/24/2018 1344   HGBUR MODERATE (A) 06/14/2018 1344   BILIRUBINUR MODERATE (A) 07/04/2018 1344   KETONESUR 15 (A) 06/25/2018 1344   PROTEINUR 100 (A) 06/14/2018 1344   NITRITE POSITIVE (A) 06/30/2018 1344   LEUKOCYTESUR TRACE (A) 07/06/2018 1344     ROS:  Pertinent items noted in HPI and remainder of comprehensive ROS otherwise negative.  Physical Exam: Vitals:   06/09/2018 1445 06/24/2018 1500  BP: 105/61 112/66  Pulse: 84 (!) 125  Resp: 20 19  Temp:    SpO2: 100% 100%     General: chronically ill appearing elderly male  HEENT: NCAT Eyes: EOMI; sclera anicteric Heart:tachycardia S1S2; no rub Lungs: clear to ausculation anteriorly; breathing comfortably Abdomen: soft/NT/ND; normal bowel sounds Extremities: 2-3+ edema LLE and 1+ edema RLE Skin: erythema and warmth of left lower leg Neuro: awake and conversant  Psych: calm affect; no agitation Access: right chest ash split catheter   Assessment/Plan:  # End-stage renal disease - Normally on dialysis TTS no acute indication for dialysis today and s/p volume resuscitation for septic shock, on levophed, K acceptable.   - Reassess dialysis on 5/8 and hemodynamics would dictate HD vs. CRRT  # Septic shock - Felt secondary to cellulitis  - Initiated on levophed in the ER; team is working to wean  - Pressors per pulmonology/critical care  - Discontinue rate of fluids.  Spoke with critical care and they agree - Blood cultures  (peripheral) sent   # Acute hypoxic respiratory failure  - Discontinue IV fluids  - Breathing comfortably and they are weaning support as tolerated  # Cirrhosis with hepatic encephalopathy - Noted on lactulose  # CAD s/p CABG - Off of metoprolol with hypotension   # Afib with RVR - Off of metoprolol with  hypotension - Per primary team   Claudia Desanctis 06/14/2018, 4:08 PM

## 2018-06-12 NOTE — ED Notes (Signed)
This RN acting as Art therapist and asked if I could reach out to pt's family for an update. Gained permission to call pt's dtr, Tammi. I informed Tammi about pt's clinical course in the ED and gave her the phone number of the unit he will be admitted to. Pt's dtr very thankful for the updates.

## 2018-06-12 NOTE — ED Triage Notes (Signed)
Pt BIB [facility] for eval of cellulitis of R shin, EMS reports pt fell a few weeks ago and injured R shin, family had been watching injury and noted today increasing redness, pain and warmth. Pt also fell last night while trying to walk to bathroom w/ scattered skin tears. Pt arrives Alert and conversive. Pt also in Afib RVR 527-129W, systolics in the 90R,

## 2018-06-13 ENCOUNTER — Inpatient Hospital Stay (HOSPITAL_COMMUNITY): Payer: Medicare Other

## 2018-06-13 DIAGNOSIS — Z452 Encounter for adjustment and management of vascular access device: Secondary | ICD-10-CM

## 2018-06-13 DIAGNOSIS — I4891 Unspecified atrial fibrillation: Secondary | ICD-10-CM

## 2018-06-13 LAB — CBC
HCT: 41.7 % (ref 39.0–52.0)
Hemoglobin: 13.2 g/dL (ref 13.0–17.0)
MCH: 27 pg (ref 26.0–34.0)
MCHC: 31.7 g/dL (ref 30.0–36.0)
MCV: 85.5 fL (ref 80.0–100.0)
Platelets: 52 10*3/uL — ABNORMAL LOW (ref 150–400)
RBC: 4.88 MIL/uL (ref 4.22–5.81)
RDW: 22 % — ABNORMAL HIGH (ref 11.5–15.5)
WBC: 7.8 10*3/uL (ref 4.0–10.5)
nRBC: 0.8 % — ABNORMAL HIGH (ref 0.0–0.2)

## 2018-06-13 LAB — BASIC METABOLIC PANEL
Anion gap: 19 — ABNORMAL HIGH (ref 5–15)
BUN: 30 mg/dL — ABNORMAL HIGH (ref 8–23)
CO2: 18 mmol/L — ABNORMAL LOW (ref 22–32)
Calcium: 8.7 mg/dL — ABNORMAL LOW (ref 8.9–10.3)
Chloride: 96 mmol/L — ABNORMAL LOW (ref 98–111)
Creatinine, Ser: 3.84 mg/dL — ABNORMAL HIGH (ref 0.61–1.24)
GFR calc Af Amer: 16 mL/min — ABNORMAL LOW (ref >60)
GFR calc non Af Amer: 14 mL/min — ABNORMAL LOW (ref >60)
Glucose, Bld: 69 mg/dL — ABNORMAL LOW (ref 70–99)
Potassium: 3.9 mmol/L (ref 3.5–5.1)
Sodium: 133 mmol/L — ABNORMAL LOW (ref 135–145)

## 2018-06-13 LAB — POCT I-STAT 7, (LYTES, BLD GAS, ICA,H+H)
Acid-base deficit: 5 mmol/L — ABNORMAL HIGH (ref 0.0–2.0)
Bicarbonate: 22.1 mmol/L (ref 20.0–28.0)
Calcium, Ion: 1.2 mmol/L (ref 1.15–1.40)
HCT: 43 % (ref 39.0–52.0)
Hemoglobin: 14.6 g/dL (ref 13.0–17.0)
O2 Saturation: 89 %
Patient temperature: 97.3
Potassium: 3.5 mmol/L (ref 3.5–5.1)
Sodium: 132 mmol/L — ABNORMAL LOW (ref 135–145)
TCO2: 24 mmol/L (ref 22–32)
pCO2 arterial: 47.7 mmHg (ref 32.0–48.0)
pH, Arterial: 7.27 — ABNORMAL LOW (ref 7.350–7.450)
pO2, Arterial: 63 mmHg — ABNORMAL LOW (ref 83.0–108.0)

## 2018-06-13 LAB — RENAL FUNCTION PANEL
Albumin: 2.4 g/dL — ABNORMAL LOW (ref 3.5–5.0)
Anion gap: 14 (ref 5–15)
BUN: 29 mg/dL — ABNORMAL HIGH (ref 8–23)
CO2: 21 mmol/L — ABNORMAL LOW (ref 22–32)
Calcium: 8.3 mg/dL — ABNORMAL LOW (ref 8.9–10.3)
Chloride: 97 mmol/L — ABNORMAL LOW (ref 98–111)
Creatinine, Ser: 3.18 mg/dL — ABNORMAL HIGH (ref 0.61–1.24)
GFR calc Af Amer: 21 mL/min — ABNORMAL LOW (ref >60)
GFR calc non Af Amer: 18 mL/min — ABNORMAL LOW (ref >60)
Glucose, Bld: 136 mg/dL — ABNORMAL HIGH (ref 70–99)
Phosphorus: 2.5 mg/dL (ref 2.5–4.6)
Potassium: 3.9 mmol/L (ref 3.5–5.1)
Sodium: 132 mmol/L — ABNORMAL LOW (ref 135–145)

## 2018-06-13 LAB — BLOOD GAS, ARTERIAL
Acid-base deficit: 4.1 mmol/L — ABNORMAL HIGH (ref 0.0–2.0)
Bicarbonate: 20.8 mmol/L (ref 20.0–28.0)
Drawn by: 331001
FIO2: 55
O2 Saturation: 84.4 %
Patient temperature: 97.2
pCO2 arterial: 39.1 mmHg (ref 32.0–48.0)
pH, Arterial: 7.341 — ABNORMAL LOW (ref 7.350–7.450)
pO2, Arterial: 49.2 mmHg — ABNORMAL LOW (ref 83.0–108.0)

## 2018-06-13 LAB — URINE CULTURE: Culture: NO GROWTH

## 2018-06-13 LAB — GLUCOSE, CAPILLARY
Glucose-Capillary: 69 mg/dL — ABNORMAL LOW (ref 70–99)
Glucose-Capillary: 99 mg/dL (ref 70–99)
Glucose-Capillary: 56 mg/dL — ABNORMAL LOW (ref 70–99)
Glucose-Capillary: 91 mg/dL (ref 70–99)
Glucose-Capillary: 48 mg/dL — ABNORMAL LOW (ref 70–99)
Glucose-Capillary: 84 mg/dL (ref 70–99)
Glucose-Capillary: 98 mg/dL (ref 70–99)
Glucose-Capillary: 99 mg/dL (ref 70–99)

## 2018-06-13 LAB — AMMONIA: Ammonia: 49 umol/L — ABNORMAL HIGH (ref 9–35)

## 2018-06-13 LAB — CK: Total CK: 385 U/L (ref 49–397)

## 2018-06-13 LAB — PHOSPHORUS: Phosphorus: 4.2 mg/dL (ref 2.5–4.6)

## 2018-06-13 LAB — LACTIC ACID, PLASMA: Lactic Acid, Venous: 4.4 mmol/L (ref 0.5–1.9)

## 2018-06-13 LAB — MAGNESIUM: Magnesium: 1.8 mg/dL (ref 1.7–2.4)

## 2018-06-13 MED ORDER — SODIUM BICARBONATE 8.4 % IV SOLN
INTRAVENOUS | Status: DC
  Administered 2018-06-13 – 2018-06-15 (×3): via INTRAVENOUS
  Filled 2018-06-13 (×4): qty 150

## 2018-06-13 MED ORDER — ORAL CARE MOUTH RINSE
15.0000 mL | Freq: Two times a day (BID) | OROMUCOSAL | Status: DC
  Administered 2018-06-14 – 2018-06-15 (×4): 15 mL via OROMUCOSAL

## 2018-06-13 MED ORDER — PRISMASOL BGK 4/2.5 32-4-2.5 MEQ/L REPLACEMENT SOLN
Status: DC
  Administered 2018-06-13 – 2018-06-15 (×3): via INTRAVENOUS_CENTRAL
  Filled 2018-06-13 (×3): qty 5000

## 2018-06-13 MED ORDER — MAGNESIUM SULFATE 2 GM/50ML IV SOLN
2.0000 g | Freq: Once | INTRAVENOUS | Status: AC
  Administered 2018-06-13: 13:00:00 2 g via INTRAVENOUS
  Filled 2018-06-13: qty 50

## 2018-06-13 MED ORDER — SODIUM CHLORIDE 0.9 % IV SOLN
2.0000 g | Freq: Two times a day (BID) | INTRAVENOUS | Status: DC
  Administered 2018-06-13 – 2018-06-15 (×4): 2 g via INTRAVENOUS
  Filled 2018-06-13 (×6): qty 2

## 2018-06-13 MED ORDER — PRISMASOL BGK 4/2.5 32-4-2.5 MEQ/L IV SOLN
INTRAVENOUS | Status: DC
  Administered 2018-06-13 – 2018-06-15 (×14): via INTRAVENOUS_CENTRAL
  Filled 2018-06-13 (×22): qty 5000

## 2018-06-13 MED ORDER — SODIUM CHLORIDE 0.9 % FOR CRRT
INTRAVENOUS_CENTRAL | Status: DC | PRN
  Administered 2018-06-13: 20:00:00 via INTRAVENOUS_CENTRAL
  Filled 2018-06-13 (×2): qty 1000

## 2018-06-13 MED ORDER — VANCOMYCIN HCL IN DEXTROSE 750-5 MG/150ML-% IV SOLN
750.0000 mg | INTRAVENOUS | Status: DC
  Administered 2018-06-13 – 2018-06-15 (×3): 750 mg via INTRAVENOUS
  Filled 2018-06-13 (×3): qty 150

## 2018-06-13 MED ORDER — VANCOMYCIN HCL IN DEXTROSE 750-5 MG/150ML-% IV SOLN
750.0000 mg | INTRAVENOUS | Status: DC
  Filled 2018-06-13: qty 150

## 2018-06-13 MED ORDER — DEXTROSE 50 % IV SOLN
25.0000 mL | Freq: Once | INTRAVENOUS | Status: AC
  Administered 2018-06-13: 25 mL via INTRAVENOUS

## 2018-06-13 MED ORDER — PRISMASOL BGK 4/2.5 32-4-2.5 MEQ/L REPLACEMENT SOLN
Status: DC
  Administered 2018-06-13 – 2018-06-15 (×5): via INTRAVENOUS_CENTRAL
  Filled 2018-06-13 (×10): qty 5000

## 2018-06-13 MED ORDER — LIP MEDEX EX OINT
TOPICAL_OINTMENT | CUTANEOUS | Status: DC | PRN
  Administered 2018-06-13: 21:00:00 via TOPICAL
  Administered 2018-06-14 (×2): 1 via TOPICAL
  Filled 2018-06-13: qty 7

## 2018-06-13 MED ORDER — DEXTROSE 50 % IV SOLN
INTRAVENOUS | Status: AC
  Administered 2018-06-13: 25 mL
  Filled 2018-06-13: qty 50

## 2018-06-13 MED ORDER — SODIUM CHLORIDE 0.9 % IV SOLN
2.0000 g | Freq: Two times a day (BID) | INTRAVENOUS | Status: DC
  Filled 2018-06-13: qty 2

## 2018-06-13 MED ORDER — AMIODARONE HCL IN DEXTROSE 360-4.14 MG/200ML-% IV SOLN
30.0000 mg/h | INTRAVENOUS | Status: DC
  Administered 2018-06-13 – 2018-06-15 (×5): 30 mg/h via INTRAVENOUS
  Filled 2018-06-13 (×5): qty 200

## 2018-06-13 MED ORDER — HEPARIN SODIUM (PORCINE) 1000 UNIT/ML DIALYSIS
1000.0000 [IU] | INTRAMUSCULAR | Status: DC | PRN
  Filled 2018-06-13: qty 6

## 2018-06-13 MED ORDER — DEXTROSE 5 % IV SOLN
INTRAVENOUS | Status: DC
  Administered 2018-06-13 – 2018-06-15 (×3): via INTRAVENOUS

## 2018-06-13 MED ORDER — AMIODARONE HCL IN DEXTROSE 360-4.14 MG/200ML-% IV SOLN
60.0000 mg/h | INTRAVENOUS | Status: AC
  Administered 2018-06-13 (×2): 60 mg/h via INTRAVENOUS
  Filled 2018-06-13: qty 200

## 2018-06-13 MED ORDER — AMIODARONE LOAD VIA INFUSION
150.0000 mg | Freq: Once | INTRAVENOUS | Status: AC
  Administered 2018-06-13: 150 mg via INTRAVENOUS
  Filled 2018-06-13: qty 83.34

## 2018-06-13 MED ORDER — CHLORHEXIDINE GLUCONATE 0.12 % MT SOLN
15.0000 mL | Freq: Two times a day (BID) | OROMUCOSAL | Status: DC
  Administered 2018-06-13 – 2018-06-15 (×4): 15 mL via OROMUCOSAL
  Filled 2018-06-13: qty 15

## 2018-06-13 NOTE — Progress Notes (Signed)
[facility] Progress Note    Assessment/ Plan:    78 y.o. male ESRD TTS, cirrhosis, and CASHD p/w septic shock presumably from cellulitis.  He has a left leg wound which has become erythematous and painful over the past few days.  Blood pressure was 32T systolic initially; he received IV fluids and was initiated on levophed and he is admitted to the ICU.  Nursing reports that he has received 2 liters of fluid.  Since getting the fluids he has been able to come down on the levo to 2 mcg/min.  Also w/ afib  RVR.  Coronavirus negative.  Note CABG was complicated by dialysis-dependent AKI since 03/01/2018.  He has relocated to [facility] to be closer to family.  Dialysis orders: Emilie Rutter TTS 4hr EDW 65.5 kg 3/2.5  Heparin 2000 Mircera 50 q2wk (has not been rx -> Hb>11)  1)  End-stage renal disease - Normally on dialysis TTS no acute indication for dialysis today and s/p volume resuscitation for septic shock, on levophed, K acceptable.   - He will not be able to tolerate HD + Levophed 7mcg -> will initiate CRRT w 4K bath.  2) Septic shock - Felt secondary to cellulitis  - Initiated on levophed in the ER; team is working to wean  - Pressors per pulmonology/critical care; currently on Levophed 87mcg - Blood cultures  (peripheral) sent (5/7) -> NGT   3)  Acute hypoxic respiratory failure   - Breathing comfortably on [place]   4)  Cirrhosis with hepatic encephalopathy - Noted on lactulose  5)  CAD s/p CABG - Off of metoprolol with hypotension   6) Afib with RVR - Off of metoprolol with hypotension - Per primary team   Subjective:   Confused but arousable and appropriate C/o left leg pain Denies f/c/n/v/dyspnea   Objective:   BP 97/62   Pulse (!) 119   Temp (!) 97.4 F (36.3 C) (Oral)   Resp 11   Wt 71.5 kg   SpO2 94%   BMI 24.69 kg/m   Intake/Output Summary (Last 24 hours) at 06/13/2018 0831 Last data filed at 06/13/2018 0700 Gross per 24 hour  Intake  1274.46 ml  Output 0 ml  Net 1274.46 ml   Weight change:   Physical Exam: General: chronically ill appearing elderly male  HEENT: NCAT Heart:tachycardia S1S2; no rub Lungs: clear to ausculation anteriorly; breathing comfortably Abdomen: soft/NT/ND; normal bowel sounds Extremities: 2-3+ edema LLE and 1+ edema RLE Skin: erythema and warmth of left lower leg Access: right chest ash split catheter   Imaging: Dg Tibia/fibula Left  Result Date: 06/14/2018 CLINICAL DATA:  Possible sepsis. Redness. EXAM: LEFT TIBIA AND FIBULA - 2 VIEW COMPARISON:  None. FINDINGS: There is no evidence of fracture or other focal bone lesions. Marked soft tissue swelling. Vascular calcification. Skeletal osteopenia. IMPRESSION: Marked soft tissue swelling. No fracture. Electronically Signed   By: Staci Righter M.D.   On: 06/13/2018 13:42   Dg Chest Port 1 View  Result Date: 06/13/2018 CLINICAL DATA:  Respiratory failure EXAM: PORTABLE CHEST 1 VIEW COMPARISON:  Yesterday FINDINGS: Pleural effusions that are likely large. Cardiomegaly. There has been CABG and left atrial clipping. Dialysis catheter with tip at the right atrium. Dual-chamber pacer leads from the left. IMPRESSION: Large layering pleural effusions. Electronically Signed   By: Monte Fantasia M.D.   On: 06/13/2018 08:13   Dg Chest Port 1 View  Result Date: 06/07/2018 CLINICAL DATA:  78 year old male with sepsis EXAM: PORTABLE CHEST  1 VIEW COMPARISON:  04/03/2018 FINDINGS: Cardiomediastinal silhouette unchanged with surgical changes of median sternotomy and left atrial clipping. Unchanged right IJ central venous catheter, incompletely imaged. Unchanged left chest wall cardiac pacing device. Low lung volumes. Persisting opacities at the lung bases with obscuration of the bilateral hemidiaphragm and the bilateral heart borders. No pneumothorax. IMPRESSION: Similar appearance of bibasilar pleural effusions with associated atelectasis/consolidation. Surgical  changes of median sternotomy and left atrial clipping. Unchanged right IJ hemodialysis catheter and left chest wall cardiac pacer. Electronically Signed   By: Corrie Mckusick D.O.   On: 06/20/2018 13:37   Dg Foot 2 Views Left  Result Date: 06/25/2018 CLINICAL DATA:  78 year old male with sepsis EXAM: LEFT FOOT - 2 VIEW COMPARISON:  None. FINDINGS: Lateral view demonstrates nonspecific soft tissue swelling on the forefoot, dorsal region. Dense calcifications of the tibial and pedal vessels. No displaced fracture. No subluxation/dislocation. Degenerative changes of the interphalangeal joints, midfoot, and hindfoot. IMPRESSION: Negative for acute bony abnormality. Nonspecific soft tissue swelling of the dorsal forefoot. Tibial and pedal atherosclerosis Electronically Signed   By: Corrie Mckusick D.O.   On: 06/11/2018 13:38    Labs: BMET Recent Labs  Lab 07/02/2018 1246 06/13/18 0212 06/13/18 0249  NA 133* 133* 132*  K 3.3* 3.9 3.5  CL 98 96*  --   CO2 19* 18*  --   GLUCOSE 69* 69*  --   BUN 24* 30*  --   CREATININE 4.00* 3.84*  --   CALCIUM 8.8* 8.7*  --   PHOS  --  4.2  --    CBC Recent Labs  Lab 06/24/2018 1246 06/13/18 0212 06/13/18 0249  WBC 3.7* 7.8  --   NEUTROABS 1.8  --   --   HGB 13.2 13.2 14.6  HCT 43.0 41.7 43.0  MCV 84.3 85.5  --   PLT 51* 52*  --     Medications:    . aspirin  81 mg Oral Daily  . atorvastatin  80 mg Oral q1800  . folic acid  1 mg Intravenous Daily  . lactulose  3.4 g Oral BID  . mouth rinse  15 mL Mouth Rinse BID  . off the beat book   Does not apply Once  . thiamine  100 mg Intravenous Daily      Otelia Santee, MD 06/13/2018, 8:31 AM

## 2018-06-13 NOTE — Progress Notes (Signed)
Hypoglycemic Event  CBG: 69  Treatment: 6mL D50  Symptoms: lethargic  Follow-up CBG: Time:0355 CBG Result:99  Possible Reasons for Event: Patient is NPO    Larry Fitzgerald

## 2018-06-13 NOTE — Progress Notes (Signed)
Pts SP02 fluctuating 86-94 with respirations. Discussed with RT. And have incrementally increased to 55%/15L via venturi mask.  Pt mental status remains the same as all afternoon: Confused only to time and date and drowsy but rouses to voice.

## 2018-06-13 NOTE — Progress Notes (Signed)
Renal Navigator notified OP HD clinic of patient's admission and negative COVID 19 test result to provide continuity of care and safety.  Alphonzo Cruise Renal Navigator [phone]

## 2018-06-13 NOTE — Consult Note (Addendum)
Reason for Consult:R/o compartment syndrome Referring Physician: R Khoen Larry Fitzgerald is an 78 y.o. male.  HPI: Larry Fitzgerald fell several weeks ago and suffered a wound on his left shin. They've been treating this at home and it was doing well but over last several days has shown increased redness, warmth, and pain and they came to the ED and were admitted for cellulitis. The appearance today coupled with his significant pain resulted in concern for compartment syndrome and orthopedic surgery was consulted.  Past Medical History:  Diagnosis Date  . Cancer ([place])    colon ca   . Coronary artery disease   . 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 Renal failure     Past Surgical History:  Procedure Laterality Date  . CARDIAC SURGERY     CABG   . COLON SURGERY      History reviewed. No pertinent family history.  Social History:  reports that he has never smoked. He has never used smokeless tobacco. He reports previous alcohol use. He reports that he does not use drugs.  Allergies:  Allergies  Allergen Reactions  . Penicillins Anaphylaxis    Did it involve swelling oYesf the face/tongue/throat, SOB, or low BP? Yes Did it involve sudden or severe rash/hives, skin peeling, or any reaction on the inside of your mouth or nose?  Did you need to seek medical attention at a hospital or doctor's office? Yes When did it last happen? If all above answers are "NO", may proceed with cephalosporin use.    Medications: I have reviewed the patient's current medications.  Results for orders placed or performed during the hospital encounter of 06/09/2018 (from the past 48 hour(s))  Lactic acid, plasma     Status: Abnormal   Collection Time: 06/14/2018 12:46 PM  Result Value Ref Range   Lactic Acid, Venous 5.3 (HH) 0.5 - 1.9 mmol/L    Comment: CRITICAL RESULT CALLED TO, READ BACK BY AND VERIFIED WITH: T.HAMILTON RN [id number] 06/24/2018 MCCORMICK K Performed at [facility] [address], [place],  [place] [address]   Comprehensive metabolic panel     Status: Abnormal   Collection Time: 07/01/2018 12:46 PM  Result Value Ref Range   Sodium 133 (L) 135 - 145 mmol/L   Potassium 3.3 (L) 3.5 - 5.1 mmol/L   Chloride 98 98 - 111 mmol/L   CO2 19 (L) 22 - 32 mmol/L   Glucose, Bld 69 (L) 70 - 99 mg/dL   BUN 24 (H) 8 - 23 mg/dL   Creatinine, Ser 4.00 (H) 0.61 - 1.24 mg/dL   Calcium 8.8 (L) 8.9 - 10.3 mg/dL   Total Protein 6.1 (L) 6.5 - 8.1 g/dL   Albumin 2.7 (L) 3.5 - 5.0 g/dL   AST 65 (H) 15 - 41 U/L   ALT 37 0 - 44 U/L   Alkaline Phosphatase 65 38 - 126 U/L   Total Bilirubin 1.9 (H) 0.3 - 1.2 mg/dL   GFR calc non Af Amer 14 (L) >60 mL/min   GFR calc Af Amer 16 (L) >60 mL/min   Anion gap 16 (H) 5 - 15    Comment: Performed at [facility], [place] [address] [place], [place] [address]  CBC WITH DIFFERENTIAL     Status: Abnormal   Collection Time: 06/17/2018 12:46 PM  Result Value Ref Range   WBC 3.7 (L) 4.0 - 10.5 K/uL   RBC 5.10 4.22 - 5.81 MIL/uL   Hemoglobin 13.2 13.0 - 17.0  g/dL   HCT 43.0 39.0 - 52.0 %   MCV 84.3 80.0 - 100.0 fL   MCH 25.9 (L) 26.0 - 34.0 pg   MCHC 30.7 30.0 - 36.0 g/dL   RDW 21.6 (H) 11.5 - 15.5 %   Platelets 51 (L) 150 - 400 K/uL    Comment: REPEATED TO VERIFY PLATELET COUNT CONFIRMED BY SMEAR Immature Platelet Fraction may be clinically indicated, consider ordering this additional test [id number]    nRBC 0.0 0.0 - 0.2 %   Neutrophils Relative % 49 %   Neutro Abs 1.8 1.7 - 7.7 K/uL   Lymphocytes Relative 23 %   Lymphs Abs 0.9 0.7 - 4.0 K/uL   Monocytes Relative 6 %   Monocytes Absolute 0.2 0.1 - 1.0 K/uL   Eosinophils Relative 3 %   Eosinophils Absolute 0.1 0.0 - 0.5 K/uL   Basophils Relative 1 %   Basophils Absolute 0.0 0.0 - 0.1 K/uL   WBC Morphology INCREASED BANDS (>20% BANDS)    RBC Morphology BURR CELLS    Immature Granulocytes 18 %   Abs Immature Granulocytes 0.65 (H) 0.00 - 0.07 K/uL   Polychromasia PRESENT     Comment: Performed at [facility], [address], [place], [place] [address]  Blood Culture (routine x 2)     Status: None (Preliminary result)   Collection Time: 07/05/2018 12:46 PM  Result Value Ref Range   Specimen Description BLOOD LEFT ANTECUBITAL    Special Requests      BOTTLES DRAWN AEROBIC AND ANAEROBIC Blood Culture adequate volume   Culture      NO GROWTH < 24 HOURS Performed at [facility], [place] [address] [place], [place] [address]    Report Status PENDING   Procalcitonin     Status: None   Collection Time: 06/25/2018 12:46 PM  Result Value Ref Range   Procalcitonin 26.25 ng/mL    Comment:        Interpretation: PCT >= 10 ng/mL: Important systemic inflammatory response, almost exclusively due to severe bacterial sepsis or septic shock. (NOTE)       Sepsis PCT Algorithm           Lower Respiratory Tract                                      Infection PCT Algorithm    ----------------------------     ----------------------------         PCT < 0.25 ng/mL                PCT < 0.10 ng/mL         Strongly encourage             Strongly discourage   discontinuation of antibiotics    initiation of antibiotics    ----------------------------     -----------------------------       PCT 0.25 - 0.50 ng/mL            PCT 0.10 - 0.25 ng/mL               OR       >80% decrease in PCT            Discourage initiation of  antibiotics      Encourage discontinuation           of antibiotics    ----------------------------     -----------------------------         PCT >= 0.50 ng/mL              PCT 0.26 - 0.50 ng/mL                AND       <80% decrease in PCT             Encourage initiation of                                             antibiotics       Encourage continuation           of antibiotics    ----------------------------     -----------------------------        PCT >= 0.50 ng/mL                  PCT > 0.50 ng/mL               AND          increase in PCT                  Strongly encourage                                      initiation of antibiotics    Strongly encourage escalation           of antibiotics                                     -----------------------------                                           PCT <= 0.25 ng/mL                                                 OR                                        > 80% decrease in PCT                                     Discontinue / Do not initiate                                             antibiotics Performed at [facility], [place] [address] [place],  [address]   Blood Culture (routine x 2)     Status: None (Preliminary result)  Collection Time: 06/11/2018 12:51 PM  Result Value Ref Range   Specimen Description BLOOD BLOOD LEFT HAND    Special Requests      BOTTLES DRAWN AEROBIC AND ANAEROBIC Blood Culture results may not be optimal due to an inadequate volume of blood received in culture bottles   Culture      NO GROWTH < 24 HOURS Performed at [facility] [address] [place], [place] [address]    Report Status PENDING   SARS Coronavirus 2 (CEPHEID- Performed in [facility] lab), Hosp Order     Status: None   Collection Time: 07/06/2018  1:06 PM  Result Value Ref Range   SARS Coronavirus 2 NEGATIVE NEGATIVE    Comment: (NOTE) If result is NEGATIVE SARS-CoV-2 target nucleic acids are NOT DETECTED. The SARS-CoV-2 RNA is generally detectable in upper and lower  respiratory specimens during the acute phase of infection. The lowest  concentration of SARS-CoV-2 viral copies this assay can detect is 250  copies / mL. A negative result does not preclude SARS-CoV-2 infection  and should not be used as the sole basis for treatment or other  patient management decisions.  A negative result may occur with  improper specimen collection / handling, submission of specimen other  than nasopharyngeal swab, presence of viral mutation(s)  within the  areas targeted by this assay, and inadequate number of viral copies  (<250 copies / mL). A negative result must be combined with clinical  observations, patient history, and epidemiological information. If result is POSITIVE SARS-CoV-2 target nucleic acids are DETECTED. The SARS-CoV-2 RNA is generally detectable in upper and lower  respiratory specimens dur ing the acute phase of infection.  Positive  results are indicative of active infection with SARS-CoV-2.  Clinical  correlation with patient history and other diagnostic information is  necessary to determine patient infection status.  Positive results do  not rule out bacterial infection or co-infection with other viruses. If result is PRESUMPTIVE POSTIVE SARS-CoV-2 nucleic acids MAY BE PRESENT.   A presumptive positive result was obtained on the submitted specimen  and confirmed on repeat testing.  While 2019 novel coronavirus  (SARS-CoV-2) nucleic acids may be present in the submitted sample  additional confirmatory testing may be necessary for epidemiological  and / or clinical management purposes  to differentiate between  SARS-CoV-2 and other Sarbecovirus currently known to infect humans.  If clinically indicated additional testing with an alternate test  methodology [phone]) is advised. The SARS-CoV-2 RNA is generally  detectable in upper and lower respiratory sp ecimens during the acute  phase of infection. The expected result is Negative. Fact Sheet for Patients:  StrictlyIdeas.no Fact Sheet for Healthcare Providers: BankingDealers.co.za This test is not yet approved or cleared by the [place] FDA and has been authorized for detection and/or diagnosis of SARS-CoV-2 by FDA under an Emergency Use Authorization (EUA).  This EUA will remain in effect (meaning this test can be used) for the duration of the COVID-19 declaration under Section 564(b)(1) of the Act,  21 U.S.C. section 360bbb-3(b)(1), unless the authorization is terminated or revoked sooner. Performed at [facility], [place] [address] [place], [place] [address]   Urinalysis, Routine w reflex microscopic     Status: Abnormal   Collection Time: 06/09/2018  1:44 PM  Result Value Ref Range   Color, Urine AMBER (A) YELLOW    Comment: BIOCHEMICALS MAY BE AFFECTED BY COLOR   APPearance CLEAR CLEAR   Specific Gravity,  Urine 1.025 1.005 - 1.030   pH 6.5 5.0 - 8.0   Glucose, UA 100 (A) NEGATIVE mg/dL   Hgb urine dipstick MODERATE (A) NEGATIVE   Bilirubin Urine MODERATE (A) NEGATIVE   Ketones, ur 15 (A) NEGATIVE mg/dL   Protein, ur 100 (A) NEGATIVE mg/dL   Nitrite POSITIVE (A) NEGATIVE   Leukocytes,Ua TRACE (A) NEGATIVE    Comment: Performed at [facility] [address], [place], [place] [address]  Urinalysis, Microscopic (reflex)     Status: Abnormal   Collection Time: 07/01/2018  1:44 PM  Result Value Ref Range   RBC / HPF 0-5 0 - 5 RBC/hpf   WBC, UA 11-20 0 - 5 WBC/hpf   Bacteria, UA MANY (A) NONE SEEN   Squamous Epithelial / LPF 0-5 0 - 5   WBC Clumps PRESENT    Hyaline Casts, UA PRESENT    Granular Casts, UA PRESENT    RBC Casts, UA PRESENT     Comment: Performed at [facility] [address] [place], [place] [address]  Urine culture     Status: None   Collection Time: 07/03/2018  1:45 PM  Result Value Ref Range   Specimen Description URINE, CATHETERIZED    Special Requests NONE    Culture      NO GROWTH Performed at [facility], [place] [address] [place], [place] [address]    Report Status 06/13/2018 FINAL   Glucose, capillary     Status: Abnormal   Collection Time: 06/13/2018  3:31 PM  Result Value Ref Range   Glucose-Capillary 61 (L) 70 - 99 mg/dL  Lactic acid, plasma     Status: Abnormal   Collection Time: 06/27/2018  3:33 PM  Result Value Ref Range   Lactic Acid, Venous 4.4 (HH) 0.5 - 1.9 mmol/L    Comment: CRITICAL RESULT CALLED TO, READ BACK  BY AND VERIFIED WITH: C.WILSON RN 1600 06/29/2018 MCCORMICK K Performed at [facility], [place] [address], [place], [place] [address]   Lactic acid, plasma     Status: Abnormal   Collection Time: 06/21/2018  6:40 PM  Result Value Ref Range   Lactic Acid, Venous 4.1 (HH) 0.5 - 1.9 mmol/L    Comment: CRITICAL RESULT CALLED TO, READ BACK BY AND VERIFIED WITH: C.HOWARD RN 1931 06/28/2018 MCCORMICK K Performed at [facility], [place] [address] [place], [place] [address]   MRSA PCR Screening     Status: None   Collection Time: 06/13/2018  8:30 PM  Result Value Ref Range   MRSA by PCR NEGATIVE NEGATIVE    Comment:        The GeneXpert MRSA Assay (FDA approved for NASAL specimens only), is one component of a comprehensive MRSA colonization surveillance program. It is not intended to diagnose MRSA infection nor to guide or monitor treatment for MRSA infections. Performed at [facility], [place] [address], [place], [place] [address]   Glucose, capillary     Status: Abnormal   Collection Time: 07/02/2018  9:50 PM  Result Value Ref Range   Glucose-Capillary 51 (L) 70 - 99 mg/dL   Comment 1 Notify RN    Comment 2 Document in Chart   Glucose, capillary     Status: Abnormal   Collection Time: 06/29/2018 10:13 PM  Result Value Ref Range   Glucose-Capillary 113 (H) 70 - 99 mg/dL   Comment 1 Notify RN    Comment 2 Document in Chart  CBC     Status: Abnormal   Collection Time: 06/13/18  2:12 AM  Result Value Ref Range   WBC 7.8 4.0 - 10.5 K/uL   RBC 4.88 4.22 - 5.81 MIL/uL   Hemoglobin 13.2 13.0 - 17.0 g/dL   HCT 41.7 39.0 - 52.0 %   MCV 85.5 80.0 - 100.0 fL   MCH 27.0 26.0 - 34.0 pg   MCHC 31.7 30.0 - 36.0 g/dL   RDW 22.0 (H) 11.5 - 15.5 %   Platelets 52 (L) 150 - 400 K/uL    Comment: REPEATED TO VERIFY Immature Platelet Fraction may be clinically indicated, consider ordering this additional test [id number] CONSISTENT WITH PREVIOUS RESULT    nRBC 0.8 (H) 0.0 - 0.2 %     Comment: Performed at [facility], [place] [address], [place], [place] [address]  Basic metabolic panel     Status: Abnormal   Collection Time: 06/13/18  2:12 AM  Result Value Ref Range   Sodium 133 (L) 135 - 145 mmol/L   Potassium 3.9 3.5 - 5.1 mmol/L   Chloride 96 (L) 98 - 111 mmol/L   CO2 18 (L) 22 - 32 mmol/L   Glucose, Bld 69 (L) 70 - 99 mg/dL   BUN 30 (H) 8 - 23 mg/dL   Creatinine, Ser 3.84 (H) 0.61 - 1.24 mg/dL   Calcium 8.7 (L) 8.9 - 10.3 mg/dL   GFR calc non Af Amer 14 (L) >60 mL/min   GFR calc Af Amer 16 (L) >60 mL/min   Anion gap 19 (H) 5 - 15    Comment: Performed at [facility] [address], [place], [place] [address]  Magnesium     Status: None   Collection Time: 06/13/18  2:12 AM  Result Value Ref Range   Magnesium 1.8 1.7 - 2.4 mg/dL    Comment: Performed at [facility] [address], [place], [place] [address]  Phosphorus     Status: None   Collection Time: 06/13/18  2:12 AM  Result Value Ref Range   Phosphorus 4.2 2.5 - 4.6 mg/dL    Comment: Performed at [facility] [address] [place], [place] [address]  Ammonia     Status: Abnormal   Collection Time: 06/13/18  2:31 AM  Result Value Ref Range   Ammonia 49 (H) 9 - 35 umol/L    Comment: Performed at [facility], [place] [address], [place], [place] [address]  I-STAT 7, (LYTES, BLD GAS, ICA, H+H)     Status: Abnormal   Collection Time: 06/13/18  2:49 AM  Result Value Ref Range   pH, Arterial 7.270 (L) 7.350 - 7.450   pCO2 arterial 47.7 32.0 - 48.0 mmHg   pO2, Arterial 63.0 (L) 83.0 - 108.0 mmHg   Bicarbonate 22.1 20.0 - 28.0 mmol/L   TCO2 24 22 - 32 mmol/L   O2 Saturation 89.0 %   Acid-base deficit 5.0 (H) 0.0 - 2.0 mmol/L   Sodium 132 (L) 135 - 145 mmol/L   Potassium 3.5 3.5 - 5.1 mmol/L   Calcium, Ion 1.20 1.15 - 1.40 mmol/L   HCT 43.0 39.0 - 52.0 %   Hemoglobin 14.6 13.0 - 17.0 g/dL   Patient temperature 97.3 F    Collection site RADIAL, ALLEN'S TEST ACCEPTABLE     Drawn by RT    Sample type ARTERIAL   Glucose, capillary     Status: Abnormal   Collection Time: 06/13/18  3:29 AM  Result Value Ref Range   Glucose-Capillary 69 (L) 70 - 99 mg/dL  Glucose, capillary     Status: None   Collection Time: 06/13/18  3:54 AM  Result Value Ref Range   Glucose-Capillary 99 70 - 99 mg/dL   Comment 1 Notify RN    Comment 2 Document in Chart   Glucose, capillary     Status: Abnormal   Collection Time: 06/13/18  7:13 AM  Result Value Ref Range   Glucose-Capillary 56 (L) 70 - 99 mg/dL  Glucose, capillary     Status: None   Collection Time: 06/13/18  8:02 AM  Result Value Ref Range   Glucose-Capillary 91 70 - 99 mg/dL    Dg Tibia/fibula Left  Result Date: 07/04/2018 CLINICAL DATA:  Possible sepsis. Redness. EXAM: LEFT TIBIA AND FIBULA - 2 VIEW COMPARISON:  None. FINDINGS: There is no evidence of fracture or other focal bone lesions. Marked soft tissue swelling. Vascular calcification. Skeletal osteopenia. IMPRESSION: Marked soft tissue swelling. No fracture. Electronically Signed   By: Staci Righter M.D.   On: 06/14/2018 13:42   Dg Chest Port 1 View  Result Date: 06/13/2018 CLINICAL DATA:  Respiratory failure EXAM: PORTABLE CHEST 1 VIEW COMPARISON:  Yesterday FINDINGS: Pleural effusions that are likely large. Cardiomegaly. There has been CABG and left atrial clipping. Dialysis catheter with tip at the right atrium. Dual-chamber pacer leads from the left. IMPRESSION: Large layering pleural effusions. Electronically Signed   By: Monte Fantasia M.D.   On: 06/13/2018 08:13   Dg Chest Port 1 View  Result Date: 06/16/2018 CLINICAL DATA:  78 year old male with sepsis EXAM: PORTABLE CHEST 1 VIEW COMPARISON:  04/03/2018 FINDINGS: Cardiomediastinal silhouette unchanged with surgical changes of median sternotomy and left atrial clipping. Unchanged right IJ central venous catheter, incompletely imaged. Unchanged left chest wall cardiac pacing device. Low lung volumes.  Persisting opacities at the lung bases with obscuration of the bilateral hemidiaphragm and the bilateral heart borders. No pneumothorax. IMPRESSION: Similar appearance of bibasilar pleural effusions with associated atelectasis/consolidation. Surgical changes of median sternotomy and left atrial clipping. Unchanged right IJ hemodialysis catheter and left chest wall cardiac pacer. Electronically Signed   By: Corrie Mckusick D.O.   On: 06/27/2018 13:37   Dg Foot 2 Views Left  Result Date: 07/04/2018 CLINICAL DATA:  78 year old male with sepsis EXAM: LEFT FOOT - 2 VIEW COMPARISON:  None. FINDINGS: Lateral view demonstrates nonspecific soft tissue swelling on the forefoot, dorsal region. Dense calcifications of the tibial and pedal vessels. No displaced fracture. No subluxation/dislocation. Degenerative changes of the interphalangeal joints, midfoot, and hindfoot. IMPRESSION: Negative for acute bony abnormality. Nonspecific soft tissue swelling of the dorsal forefoot. Tibial and pedal atherosclerosis Electronically Signed   By: Corrie Mckusick D.O.   On: 06/16/2018 13:38    Review of Systems  Constitutional: Negative for weight loss.  HENT: Negative for ear discharge, ear pain, hearing loss and tinnitus.   Eyes: Negative for blurred vision, double vision, photophobia and pain.  Respiratory: Negative for cough, sputum production and shortness of breath.   Cardiovascular: Negative for chest pain.  Gastrointestinal: Negative for abdominal pain, nausea and vomiting.  Genitourinary: Negative for dysuria, flank pain, frequency and urgency.  Musculoskeletal: Positive for joint pain (Left lower ext). Negative for back pain, falls, myalgias and neck pain.  Neurological: Negative for dizziness, tingling, sensory change, focal weakness, loss of consciousness and headaches.  Endo/Heme/Allergies: Does not bruise/bleed easily.  Psychiatric/Behavioral: Negative for depression, memory loss and substance  abuse. The patient  is not nervous/anxious.    Blood pressure (!) 86/61, pulse (!) 57, temperature (!) 97.4 F (36.3 C), temperature source Oral, resp. rate 18, weight 71.5 kg, SpO2 92 %. Physical Exam  Constitutional: He appears well-developed and well-nourished. No distress.  HENT:  Head: Normocephalic and atraumatic.  Eyes: Conjunctivae are normal. Right eye exhibits no discharge. Left eye exhibits no discharge. No scleral icterus.  Neck: Normal range of motion.  Cardiovascular: Normal rate and regular rhythm.  Respiratory: Effort normal. No respiratory distress.  Musculoskeletal:     Comments: LLE No traumatic wounds, ecchymosis, or rash  Severe erythema proximal lower leg and distal, bulla formation lateral and medial, mild generalized edema, compartments soft but mod TTP, severe ankle and foot TTP, severe ankle pain with passive dorsiflexion, no referrer pain to calf  No knee effusion  Knee stable to varus/ valgus and anterior/posterior stress  Sens DPN, SPN, TN intact  Motor EHL, ext, flex, evers grossly intact  DP 1+, PT 1+, No significant edema  Neurological: He is alert.  Skin: Skin is warm and dry. He is not diaphoretic.  Psychiatric: He has a normal mood and affect. His behavior is normal.    Assessment/Plan: LLE cellulitis -- Do not feel compartment syndrome is present. CT ordered, will help ensure no drainable fluid collection. If CT shows ankle effusion may need aspiration to r/o septic joint. Multiple medical problems including HTN, CAD s/p CABG with pacemaker placement, ARF on HD, and HTN -- per primary service  Update: Ct shows no large fluid collections, no joint effusions so no surgical intervention indicated. Continue treatment for cellulitis.   Lisette Abu, PA-C Orthopedic Surgery [phone] 06/13/2018, 11:20 AM

## 2018-06-13 NOTE — Progress Notes (Signed)
Hypoglycemic Event  CBG: 56 (2263)  Treatment: 25 mL D50  Symptoms: Lethargic  Follow-up CBG: Time: 0802 CBG Result: 91  Possible Reasons for Event: Pt is NPO     Denell Cothern Bethena Midget

## 2018-06-13 NOTE — Progress Notes (Signed)
Pharmacy Antibiotic Note  Larry Fitzgerald is a 78 y.o. male admitted on 06/20/2018 with sepsis.  Pharmacy has been consulted for vancomycin/aztreonam dosing.  Presenting with cellulitis of R shin s/p fall - increasing redness, pain, and warmth. Patient has a history of ESRD on TTS HD and now to transition to CRRT.  Hypothermic, WBC WNL, LA 4.1, PCT 26.  Plan: Vanc 750mg  IV Q24H Azactam 2gm IV Q12H Monitor CRRT tolerance/interruption, clinical progress  Weight: 157 lb 10.1 oz (71.5 kg)  Temp (24hrs), Avg:97 F (36.1 C), Min:96.6 F (35.9 C), Max:97.4 F (36.3 C)  Recent Labs  Lab 06/28/2018 1246 06/16/2018 1533 06/29/2018 1840 06/13/18 0212  WBC 3.7*  --   --  7.8  CREATININE 4.00*  --   --  3.84*  LATICACIDVEN 5.3* 4.4* 4.1*  --     Estimated Creatinine Clearance: 15.1 mL/min (A) (by C-G formula based on SCr of 3.84 mg/dL (H)).    Allergies  Allergen Reactions  . Penicillins Anaphylaxis    Did it involve swelling oYesf the face/tongue/throat, SOB, or low BP? Yes Did it involve sudden or severe rash/hives, skin peeling, or any reaction on the inside of your mouth or nose?  Did you need to seek medical attention at a hospital or doctor's office? Yes When did it last happen? If all above answers are "NO", may proceed with cephalosporin use.    Vanc 5/7>> Azactam 5/7>> Bactrim PTA  5/7BCx - NGTD 5/7UCx -  5/7 COVID PCR - negative 5/7 MRSA PCR - negative   Larry Mathieson D. Mina Fitzgerald, PharmD, BCPS, [place] 06/13/2018, 9:25 AM

## 2018-06-13 NOTE — Procedures (Signed)
Central Venous Catheter Insertion Procedure Note Larry Fitzgerald [id number] August 02, 1940  Procedure: Insertion of Central Venous Catheter Indications: Assessment of intravascular volume, Drug and/or fluid administration and Frequent blood sampling  Procedure Details Consent: Risks of procedure as well as the alternatives and risks of each were explained to the (patient/caregiver).  Consent for procedure obtained. Time Out: Verified patient identification, verified procedure, site/side was marked, verified correct patient position, special equipment/implants available, medications/allergies/relevent history reviewed, required imaging and test results available.  Performed  Maximum sterile technique was used including antiseptics, cap, gloves, gown, hand hygiene, mask and sheet. Skin prep: Chlorhexidine; local anesthetic administered A antimicrobial bonded/coated triple lumen catheter was placed in the left internal jugular vein using the Seldinger technique.  Evaluation Blood flow good Complications: No apparent complications Patient did tolerate procedure well. Chest X-ray ordered to verify placement.  CXR: pending.  Procedure performed under direct ultrasound guidance for real time vessel cannulation.      Larry Fitzgerald, [place] [facility] Pager: [phone]  or [phone] 06/13/2018, 1:44 PM

## 2018-06-13 NOTE — Progress Notes (Signed)
NAME:  Larry Fitzgerald, MRN:  [id number], DOB:  [birth year], LOS: 1 ADMISSION DATE:  06/20/2018, CONSULTATION DATE:  06/13/2018 REFERRING MD:  Ronnald Nian  CHIEF COMPLAINT:  Falls   Brief History   78 y/o M who presented to [facility] on 5/7 with reports of fall and concern for cellulitis of right chin. On presentation he was noted to be hypotensive with systolic in the 51'W, in [facility].  Concern for sepsis with possible cellulitis source vs urinary.   Of note, baseline BP is high 90s to low 100s.  History of present illness   The patient is a poor historian.  Information obtained from daughter, staff and prior medical documentation.  At baseline, he is from [place] but currently lives with his daughter.  He had bypass surgery in January. After that surgery he had to have a pacemaker placed. He had renal failure during that admit and now is on HD (T/Th/S).  He previously had high blood pressure before heart surgery and now runs with a systolic of 258 typically.  The patient fell approximately 4 weeks prior to admit.  Normally is independent of ADL's.  Daughter had been managing wounds at home and it was getting better.  Daughter reports she thought it had healed and then he showed her the wound two days before admit and it looked worse.  He reported pain in his right lower leg.  No fevers, chills, n/v/d.  Since heart surgery, he "breathes differently" but is not worse than normal.  Patient more confused the am of admit.  They had a video call with MD on 5/6 for RLE and he was given bactrim and had taken 3 doses.  Reports he has poor PO intake.  Daughter reports he weighs 142-146 and is stable.    78 y/o M who presented to [facility] on 5/7 with reports of fall and concern for cellulitis of left shin. EMS report noted he had fallen several weeks ago and injured his left shin.  His family had been attempting to care for it at home but he had increasing redness, pain and warmth.  He reportedly fell again the evening prior to  presentation.    On presentation he was noted to be hypotensive with systolic in the 52'D, in [facility] with a normal mental status per staff.  UA worrisome for possible UTI but patient is anuric. Labs notable for Na 133, K 3.3, Cl 98, CO2 19, glucose 69, BUN 24, Sr Cr 4, AG 16, AST 65 / ALT 37, WBC 3.7, Hgb 13.2 and platelets 51. CXR notable for bilateral pleural effusions.  Of note, baseline BP is high 90s to low 100s.  Past Medical History  ESRD on HD TTS, cirrhosis, CAD s/p CABG, PPM, colon CA.  Significant Hospital Events   5/7 > admit. 5/8 > CRRT.  Consults:  Nephrology.  Procedures:  None.  Significant Diagnostic Tests:  CXR 5/7 > bibasilar atelectasis. Left foot/tib/fib 5/7 > soft tissue swelling.  Micro Data:  Blood 5/7 >  Urine 5/7 >   Antimicrobials:  Vanc 5/7 >  Aztreonam 5/7 >    Interim history/subjective:  Breathing comfortably.  Having pain from LLE, very painful to touch.  Objective:  Blood pressure (!) 91/53, pulse 78, temperature (!) 97.4 F (36.3 C), temperature source Oral, resp. rate 12, weight 71.5 kg, SpO2 94 %.        Intake/Output Summary (Last 24 hours) at 06/13/2018 1027 Last data filed at 06/13/2018 0900 Gross per 24 hour  Intake 1341.88 ml  Output 0 ml  Net 1341.88 ml   Filed Weights   06/13/2018 1236 06/14/2018 1549 06/13/18 0212  Weight: 70 kg 70.7 kg 71.5 kg    Examination: General: Elderly appearing male, resting in bed, in NAD. Neuro: A&O x 3.  No deficits but weak to LLE due to pain. HEENT: [place]/AT. Sclerae anicteric, EOMI. Cardiovascular: RRR, no M/R/G. Lungs: Respirations even and unlabored.  CTAB. Abdomen: BS x 4, soft, NT/ND.  Musculoskeletal: LLE cellulitis from shin up to mid thigh with significant erythema.  Very tender to touch. Some ecchymosis to anterior portion as well as bullae to posterior lateral calf.  No crepitus appreciated.  Distal pulses intact. Skin: LLE cellulitis as above, skin otherwise warm.  Assessment &  Plan:   Shock - septic due to UTI and probable LLE cellulitis.  Some concern for underlying necrotizing fasciitis.   Of note, per pt his normal BP runs high 90's to low 100's. - Continue levophed for goal MAP > 65. - Continue empiric vanc / aztreonam. - Follow cultures. - CT of LLE STAT. - Have consulted orthopedics who has generously agreed to see in consultation this AM. - Repeat lactate and assess CK.  AF with RVR - started on amio bolus overnight.  Unsure of response. - Start amiodarone infusion. - Hold anticoagulation for now given thrombocytopenia, consider start in AM depending on whether LLE will require surgical intervention or not.  ESRD. Hyponatremia. AGMA - lactate + renal failure. - Nephrology following and planning for CRRT today. - Follow BMP.  Thrombocytopenia - unclear etiology, possibly due to sepsis. - Hold VTE chemoprophylaxis. - Follow platelet counts.  Recent falls. - PT consult.  Hx CAD s/p CABG. - Continue preadmission ASA, lipitor. - Hold preadmission furosemide.  Hx EtOH cirrhosis. - Continue preadmission lactulose. - Thiamine / folate.   Best Practice:  Diet: NPO. Pain/Anxiety/Delirium protocol (if indicated): N/A. VAP protocol (if indicated): None. DVT prophylaxis: SCD RLE only.  GI prophylaxis: None. Glucose control: None. Mobility: Bedrest. Code Status: Limited (no CPR). Family Communication: Daughter updated over the phone. Disposition: ICU.   Critical care time: 40 min.   [name], [place] [facility] Pager: [phone].  If no answer, [phone] 06/13/2018, 10:27 AM

## 2018-06-13 NOTE — Progress Notes (Addendum)
Inpatient Diabetes Program Recommendations  AACE/ADA: New Consensus Statement on Inpatient Glycemic Control (2015)  Target Ranges:  Prepandial:   less than 140 mg/dL      Peak postprandial:   less than 180 mg/dL (1-2 hours)      Critically ill patients:  140 - 180 mg/dL   Lab Results  Component Value Date   GLUCAP 48 (L) 06/13/2018     Noted hypoglycemia of 48 mg/dL and patient on CRRT & D5. Recommend adding CBGs Q4H.  Orders received.  Addendum: Noted order placement and meter docked.   Thanks, Bronson Curb, MSN, RNC-OB Diabetes Coordinator [phone] (8a-5p)

## 2018-06-13 NOTE — Progress Notes (Signed)
PT Cancellation Note  Patient Details Name: Larry Fitzgerald MRN: [id number] DOB: [birth year]   Cancelled Treatment:     Discussed with RN, advises to hold as patient on CRRT, pressors. Will cont to follow.  Reinaldo Berber, PT, DPT Acute Rehabilitation Services Pager: [phone] Office: [place] 06/13/2018, 4:16 PM

## 2018-06-13 NOTE — Progress Notes (Signed)
[place] Progress Note Patient Name: Larry Fitzgerald DOB: 1940-10-31 MRN: [id number]   Date of Service  06/13/2018  HPI/Events of Note  Pt is subtley more lethargic per his bedside RN, when I assessed him by camera with his RN present he was sleepy but oriented to person and place. He skipped a Lactulose dose last night.  eICU Interventions  Will check serum ammonia and ABG        [place]oron[place]wo U Ogan 06/13/2018, 2:21 AM

## 2018-06-13 NOTE — Progress Notes (Signed)
VAST RN consulted for third PIV site d/t incompatible medications. Pt has numerous skin tears and open wounds on bilateral arms. He is a renal patient and receiving CRRT this admission. Please consider placing a central line if you feel this is appropriate.

## 2018-06-13 NOTE — Consult Note (Signed)
[facility] Nurse wound consult note Reason for Consult: LLE with cellulitis, edema. Weeping large blisters (serous) on the medial and lateral aspects of the LE, two healing full thickness wounds on the anterior LE. Edema and induration extends to inguinal area, outlined by previous provider. Deep purple discoloration of the lower extremity, medial > lateral. Patient indicates pain upon gentle movement required for assessment. Wound type: Infectious Pressure Injury POA: N/A Measurement: 25cm long area of deep purple discoloration to medial LE, Blister with serous weeping in periwound site at medial LE: blister measures 4cm x 3cm.   15cm long area of deep purple discoloration to lateral LE, blister with serous weeping in periwound site at lateral LE measures 3cm x 2cm.  Two wounds (healing, full thickness) at the anterior LE: distal measures 3cm x 2cm x 0.1cm and proximal measures 3.5cm x 1.5cm with depth obscured by dried serum (scab).  No drainage from these wounds, no surrounding erythema.  Heels are cracked and dry bilaterally.  Wound bed:As described above Drainage (amount, consistency, odor) As described above Periwound: As described above.  Deeply discolored purple left LE.  Dressing procedure/placement/frequency: I will provide guidance for conservative topical care using an antimicrobial impregnated gauze (xeroform).  I will provide bilateral pressure redistribution heel boots.  [place] nursing team will not follow, but will remain available to this patient, the nursing and medical teams.  Please re-consult if needed. Thanks, Maudie Flakes, MSN, RN, [place], Arther Abbott  Pager# [phone]

## 2018-06-14 ENCOUNTER — Inpatient Hospital Stay (HOSPITAL_COMMUNITY): Payer: Medicare Other

## 2018-06-14 DIAGNOSIS — I70262 Atherosclerosis of native arteries of extremities with gangrene, left leg: Secondary | ICD-10-CM

## 2018-06-14 LAB — GLUCOSE, CAPILLARY
Glucose-Capillary: 104 mg/dL — ABNORMAL HIGH (ref 70–99)
Glucose-Capillary: 105 mg/dL — ABNORMAL HIGH (ref 70–99)
Glucose-Capillary: 112 mg/dL — ABNORMAL HIGH (ref 70–99)
Glucose-Capillary: 116 mg/dL — ABNORMAL HIGH (ref 70–99)
Glucose-Capillary: 95 mg/dL (ref 70–99)
Glucose-Capillary: 98 mg/dL (ref 70–99)

## 2018-06-14 LAB — CBC
HCT: 39 % (ref 39.0–52.0)
Hemoglobin: 12.3 g/dL — ABNORMAL LOW (ref 13.0–17.0)
MCH: 25.7 pg — ABNORMAL LOW (ref 26.0–34.0)
MCHC: 31.5 g/dL (ref 30.0–36.0)
MCV: 81.6 fL (ref 80.0–100.0)
Platelets: 27 10*3/uL — CL (ref 150–400)
RBC: 4.78 MIL/uL (ref 4.22–5.81)
RDW: 21.3 % — ABNORMAL HIGH (ref 11.5–15.5)
WBC: 9.1 10*3/uL (ref 4.0–10.5)
nRBC: 1.5 % — ABNORMAL HIGH (ref 0.0–0.2)

## 2018-06-14 LAB — RENAL FUNCTION PANEL
Albumin: 2.3 g/dL — ABNORMAL LOW (ref 3.5–5.0)
Albumin: 2.3 g/dL — ABNORMAL LOW (ref 3.5–5.0)
Anion gap: 19 — ABNORMAL HIGH (ref 5–15)
Anion gap: 14 (ref 5–15)
BUN: 19 mg/dL (ref 8–23)
BUN: 15 mg/dL (ref 8–23)
CO2: 20 mmol/L — ABNORMAL LOW (ref 22–32)
CO2: 20 mmol/L — ABNORMAL LOW (ref 22–32)
Calcium: 8.3 mg/dL — ABNORMAL LOW (ref 8.9–10.3)
Calcium: 8.1 mg/dL — ABNORMAL LOW (ref 8.9–10.3)
Chloride: 95 mmol/L — ABNORMAL LOW (ref 98–111)
Chloride: 98 mmol/L (ref 98–111)
Creatinine, Ser: 2.28 mg/dL — ABNORMAL HIGH (ref 0.61–1.24)
Creatinine, Ser: 1.7 mg/dL — ABNORMAL HIGH (ref 0.61–1.24)
GFR calc Af Amer: 31 mL/min — ABNORMAL LOW (ref >60)
GFR calc Af Amer: 44 mL/min — ABNORMAL LOW (ref >60)
GFR calc non Af Amer: 27 mL/min — ABNORMAL LOW (ref >60)
GFR calc non Af Amer: 38 mL/min — ABNORMAL LOW (ref >60)
Glucose, Bld: 105 mg/dL — ABNORMAL HIGH (ref 70–99)
Glucose, Bld: 149 mg/dL — ABNORMAL HIGH (ref 70–99)
Phosphorus: 1.7 mg/dL — ABNORMAL LOW (ref 2.5–4.6)
Phosphorus: 2.2 mg/dL — ABNORMAL LOW (ref 2.5–4.6)
Potassium: 3.5 mmol/L (ref 3.5–5.1)
Potassium: 3.5 mmol/L (ref 3.5–5.1)
Sodium: 134 mmol/L — ABNORMAL LOW (ref 135–145)
Sodium: 132 mmol/L — ABNORMAL LOW (ref 135–145)

## 2018-06-14 LAB — CK TOTAL AND CKMB (NOT AT ARMC)
CK, MB: 5.2 ng/mL — ABNORMAL HIGH (ref 0.5–5.0)
Relative Index: 4.2 — ABNORMAL HIGH (ref 0.0–2.5)
Total CK: 125 U/L (ref 49–397)

## 2018-06-14 LAB — MAGNESIUM: Magnesium: 2.2 mg/dL (ref 1.7–2.4)

## 2018-06-14 MED ORDER — FAMOTIDINE 20 MG PO TABS
20.0000 mg | ORAL_TABLET | Freq: Every day | ORAL | Status: DC
  Administered 2018-06-14: 20 mg via ORAL
  Filled 2018-06-14: qty 1

## 2018-06-14 MED ORDER — RESOURCE THICKENUP CLEAR PO POWD
ORAL | Status: DC | PRN
  Filled 2018-06-14: qty 125

## 2018-06-14 MED ORDER — MIDODRINE HCL 5 MG PO TABS
10.0000 mg | ORAL_TABLET | Freq: Three times a day (TID) | ORAL | Status: DC
  Administered 2018-06-14 – 2018-06-15 (×3): 10 mg via ORAL
  Filled 2018-06-14 (×6): qty 2

## 2018-06-14 MED ORDER — SODIUM PHOSPHATES 45 MMOLE/15ML IV SOLN
10.0000 mmol | Freq: Once | INTRAVENOUS | Status: AC
  Administered 2018-06-14: 10 mmol via INTRAVENOUS
  Filled 2018-06-14: qty 3.33

## 2018-06-14 NOTE — Evaluation (Signed)
Clinical/Bedside Swallow Evaluation Patient Details  Name: Larry Fitzgerald MRN: [id number] Date of Birth: 10/13/1940  Today's Date: 06/14/2018 Time: SLP Start Time (ACUTE ONLY): 1420 SLP Stop Time (ACUTE ONLY): 1430 SLP Time Calculation (min) (ACUTE ONLY): 10 min  Past Medical History:  Past Medical History:  Diagnosis Date  . Cancer ([place])    colon ca   . Coronary artery disease   . Liver cirrhosis ([place])   . Renal failure    Past Surgical History:  Past Surgical History:  Procedure Laterality Date  . CARDIAC SURGERY     CABG   . COLON SURGERY     HPI:  78 y/o M who presented to [facility] on 5/7 with reports of fall and concern for cellulitis of left shin. EMS report noted he had fallen several weeks ago and injured his left shin.  His family had been attempting to care for it at home but he had increasing redness, pain and warmth. Septic shock due to UTI and LLE cellulitis. ETOH cirrhosis, delirium. Has not been intubated this admission.    Assessment / Plan / Recommendation Clinical Impression  Pt demonstrates signs of pharyngeal dysphagia suspected to be more chronic in nature than acute. Pt vaguely reports he was coughing when drinking before he was admitted but cannot explain futher. He is observed to have grimacing and effort with every attempt to initiate a swallow, sometimes he even appears to have significant pain. When taking ice, puree and nectar there is no sign of aspiration, but immediate cough follows sips of thin water. Suspect a cervical esophageal or esophageal etiology. Pt is too weak to attempt MBS at this time. Recommend a nectar thick/puree diet with meds crushed until futher instrumental assessment is available. Will f/u for tolerance.  SLP Visit Diagnosis: Dysphagia, unspecified (R13.10)    Aspiration Risk  Moderate aspiration risk;Risk for inadequate nutrition/hydration    Diet Recommendation Dysphagia 1 (Puree);Nectar-thick liquid   Liquid Administration via:  Straw Medication Administration: Crushed with puree Supervision: Staff to assist with self feeding Compensations: Slow rate;Small sips/bites Postural Changes: Seated upright at 90 degrees    Other  Recommendations Oral Care Recommendations: Oral care BID Other Recommendations: Have oral suction available;Order thickener from pharmacy   Follow up Recommendations Skilled Nursing facility      Frequency and Duration min 2x/week  2 weeks       Prognosis        Swallow Study   General HPI: 78 y/o M who presented to [facility] on 5/7 with reports of fall and concern for cellulitis of left shin. EMS report noted he had fallen several weeks ago and injured his left shin.  His family had been attempting to care for it at home but he had increasing redness, pain and warmth. Septic shock due to UTI and LLE cellulitis. ETOH cirrhosis, delirium. Has not been intubated this admission.  Type of Study: Bedside Swallow Evaluation Previous Swallow Assessment: none Diet Prior to this Study: NPO Temperature Spikes Noted: No Respiratory Status: Nasal cannula History of Recent Intubation: No Behavior/Cognition: Alert;Cooperative Oral Cavity Assessment: Within Functional Limits Oral Care Completed by SLP: No Oral Cavity - Dentition: Poor condition Vision: Functional for self-feeding Self-Feeding Abilities: Total assist Patient Positioning: Upright in bed Baseline Vocal Quality: Normal Volitional Cough: Strong Volitional Swallow: Unable to elicit    Oral/Motor/Sensory Function Overall Oral Motor/Sensory Function: Within functional limits   Ice Chips     Thin Liquid Thin Liquid: Impaired Presentation: Cup;Straw Pharyngeal  Phase Impairments: Cough -  Immediate Other Comments: grimace/effort    Nectar Thick Nectar Thick Liquid: Impaired Presentation: Straw Other Comments: grimace/effort   Honey Thick Honey Thick Liquid: Not tested   Puree Puree: Impaired Other Comments: grimace/effort    Solid     Solid: Not tested     Herbie Baltimore, MA CCC-SLP  Acute Rehabilitation Services Pager [phone] Office [phone]  Morena Mckissack, Katherene Ponto 06/14/2018,2:36 PM

## 2018-06-14 NOTE — Progress Notes (Signed)
CRITICAL VALUE ALERT  Critical Value:  Platelets 27  Date & Time Notied:  06/14/18  06:17  Provider Notified: Warren Lacy   Orders Received/Actions taken: none at this time.

## 2018-06-14 NOTE — Progress Notes (Signed)
NAME:  Larry Fitzgerald, MRN:  [id number], DOB:  01/24/1941, LOS: 2 ADMISSION DATE:  06/13/2018, CONSULTATION DATE:  07/05/2018 REFERRING MD:  Ronnald Nian  CHIEF COMPLAINT:  Falls   Brief History   .  At baseline, he is from [place] but currently lives with his daughter.  He had bypass surgery in January. After that surgery he had to have a pacemaker placed. He had renal failure during that admit and now is on HD (T/Th/S).  He previously had high blood pressure before heart surgery and now runs with a systolic of 657 typically.  The patient fell approximately 4 weeks prior to admit.  Normally is independent of ADL's.  Daughter had been managing wounds at home and it was getting better.  Daughter reports she thought it had healed and then he showed her the wound two days before admit and it looked worse.  He reported pain in his right lower leg.  No fevers, chills, n/v/d.  Since heart surgery, he "breathes differently" but is not worse than normal.  Patient more confused the am of admit.  They had a video call with MD on 5/6 for RLE and he was given bactrim and had taken 3 doses.  Reports he has poor PO intake.  Daughter reports he weighs 142-146 and is stable.    78 y/o M who presented to [facility] on 5/7 with reports of fall and concern for cellulitis of left shin. EMS report noted he had fallen several weeks ago and injured his left shin.  His family had been attempting to care for it at home but he had increasing redness, pain and warmth.  He reportedly fell again the evening prior to presentation.    On presentation he was noted to be hypotensive with systolic in the 84'O, in [facility] with a normal mental status per staff.  UA worrisome for possible UTI but patient is anuric. Labs notable for Na 133, K 3.3, Cl 98, CO2 19, glucose 69, BUN 24, Sr Cr 4, AG 16, AST 65 / ALT 37, WBC 3.7, Hgb 13.2 and platelets 51. CXR notable for bilateral pleural effusions.  Of note, baseline BP is high 90s to low 100s.   EVENTS    5/8 - Breathing comfortably.  Having pain from LLE, very painful to touch.   Past Medical History  ESRD on HD TTS, cirrhosis, CAD s/p CABG, PPM, colon CA.  reports that he has never smoked. He has never used smokeless tobacco.   Significant Hospital Events   5/7 > admit. 5/8 > CRRT.  Consults:  Nephrology.  Procedures:  None.  Significant Diagnostic Tests:  CXR 5/7 > bibasilar atelectasis. Left foot/tib/fib 5/7 > soft tissue swelling.  Micro Data:  Blood 5/7 >  Urine 5/7 >   Antimicrobials:  Vanc 5/7 >  Aztreonam 5/7 >    Interim history/subjective:   5/9 - worsening hypoxemia to 16L HFNC . LEvophed at 13. On amio gtt. Not on vent. Mild hypoactive intermittent delirium +. LLE very painful - conservative Rx per CCS. CRRT contnues Objective:  Blood pressure (!) 89/56, pulse (!) 121, temperature (!) 96.8 F (36 C), temperature source Axillary, resp. rate 19, weight 71.5 kg, SpO2 90 %.    FiO2 (%):  [40 %-55 %] 55 %   Intake/Output Summary (Last 24 hours) at 06/14/2018 0939 Last data filed at 06/14/2018 0900 Gross per 24 hour  Intake 3911.16 ml  Output 3565 ml  Net 346.16 ml   Filed Weights   07/06/2018  1236 06/16/2018 1549 06/13/18 0212  Weight: 70 kg 70.7 kg 71.5 kg   General Appearance: cachectic, lying calm in bed Head:  Normocephalic, without obvious abnormality, atraumatic Eyes:  PERRL - yes, conjunctiva/corneas - muddy     Ears:  Normal external ear canals, both ears Nose:  G tube - no but has [place] Throat:  ETT TUBE - no , OG tube - no Neck:  Supple,  No enlargement/tenderness/nodules Lungs: Clear to auscultation bilaterally, Barrell chest. Rt HD cath tunnel Heart:  S1 and S2 normal, no murmur, CVP - n.  Pressors - YES Abdomen:  Soft, no masses, no organomegaly Genitalia / Rectal:  Not done Extremities:  Extremities- intact but LLL in boots with area of demarcatino that is ? Better but stil painful Skin: LLE cellulitis Neurologic:  Sedation - none -> RASS -  +! Marland Kitchen Moves all 4s - yes. CAM-ICU - mild positive for delirium . Orientation - moves all 4s   LABS    PULMONARY Recent Labs  Lab 06/13/18 0249 06/13/18 2140  PHART 7.270* 7.341*  PCO2ART 47.7 39.1  PO2ART 63.0* 49.2*  HCO3 22.1 20.8  TCO2 24  --   O2SAT 89.0 84.4    CBC Recent Labs  Lab 06/14/2018 1246 06/13/18 0212 06/13/18 0249 06/14/18 0500  HGB 13.2 13.2 14.6 12.3*  HCT 43.0 41.7 43.0 39.0  WBC 3.7* 7.8  --  9.1  PLT 51* 52*  --  27*    COAGULATION No results for input(s): INR in the last 168 hours.  CARDIAC  No results for input(s): TROPONINI in the last 168 hours. No results for input(s): PROBNP in the last 168 hours.   CHEMISTRY Recent Labs  Lab 06/27/2018 1246 06/13/18 0212 06/13/18 0249 06/13/18 1612 06/14/18 0500  NA 133* 133* 132* 132* 134*  K 3.3* 3.9 3.5 3.9 3.5  CL 98 96*  --  97* 95*  CO2 19* 18*  --  21* 20*  GLUCOSE 69* 69*  --  136* 105*  BUN 24* 30*  --  29* 19  CREATININE 4.00* 3.84*  --  3.18* 2.28*  CALCIUM 8.8* 8.7*  --  8.3* 8.3*  MG  --  1.8  --   --  2.2  PHOS  --  4.2  --  2.5 1.7*   Estimated Creatinine Clearance: 25.4 mL/min (A) (by C-G formula based on SCr of 2.28 mg/dL (H)).   LIVER Recent Labs  Lab 06/28/2018 1246 06/13/18 1612 06/14/18 0500  AST 65*  --   --   ALT 37  --   --   ALKPHOS 65  --   --   BILITOT 1.9*  --   --   PROT 6.1*  --   --   ALBUMIN 2.7* 2.4* 2.3*     INFECTIOUS Recent Labs  Lab 06/16/2018 1246 06/13/2018 1533 06/10/2018 1840 06/13/18 1110  LATICACIDVEN 5.3* 4.4* 4.1* 4.4*  PROCALCITON 26.25  --   --   --      ENDOCRINE CBG (last 3)  Recent Labs    06/13/18 2323 06/14/18 0336 06/14/18 0710  GLUCAP 99 95 98         IMAGING x48h  - image(s) personally visualized  -   highlighted in bold Dg Tibia/fibula Left  Result Date: 06/14/2018 CLINICAL DATA:  Possible sepsis. Redness. EXAM: LEFT TIBIA AND FIBULA - 2 VIEW COMPARISON:  None. FINDINGS: There is no evidence of fracture or  other focal bone lesions. Marked soft tissue swelling. Vascular  calcification. Skeletal osteopenia. IMPRESSION: Marked soft tissue swelling. No fracture. Electronically Signed   By: Staci Righter M.D.   On: 06/11/2018 13:42   Dg Chest Port 1 View  Result Date: 06/13/2018 CLINICAL DATA:  New central line placement EXAM: PORTABLE CHEST 1 VIEW COMPARISON:  06/13/2018 FINDINGS: Left IJ central venous catheter with the tip projecting over the SVC. Dual lumen right-sided central venous catheter with the tip projecting over the cavoatrial junction. There is bilateral small pleural effusions. There is bilateral interstitial and alveolar airspace opacity. There is no pneumothorax. The heart and mediastinal contours are stable. There is evidence of prior CABG. 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 The osseous structures are unremarkable. IMPRESSION: 1. Left jugular central venous catheter with the tip projecting over the SVC 2. CHF. Electronically Signed   By: Kathreen Devoid   On: 06/13/2018 14:12   Dg Chest Port 1 View  Result Date: 06/13/2018 CLINICAL DATA:  Respiratory failure EXAM: PORTABLE CHEST 1 VIEW COMPARISON:  Yesterday FINDINGS: Pleural effusions that are likely large. Cardiomegaly. There has been CABG and left atrial clipping. Dialysis catheter with tip at the right atrium. Dual-chamber pacer leads from the left. IMPRESSION: Large layering pleural effusions. Electronically Signed   By: Monte Fantasia M.D.   On: 06/13/2018 08:13   Dg Chest Port 1 View  Result Date: 06/08/2018 CLINICAL DATA:  78 year old male with sepsis EXAM: PORTABLE CHEST 1 VIEW COMPARISON:  04/03/2018 FINDINGS: Cardiomediastinal silhouette unchanged with surgical changes of median sternotomy and left atrial clipping. Unchanged right IJ central venous catheter, incompletely imaged. Unchanged left chest wall cardiac pacing device. Low lung volumes. Persisting opacities at the lung bases with obscuration of the bilateral hemidiaphragm and  the bilateral heart borders. No pneumothorax. IMPRESSION: Similar appearance of bibasilar pleural effusions with associated atelectasis/consolidation. Surgical changes of median sternotomy and left atrial clipping. Unchanged right IJ hemodialysis catheter and left chest wall cardiac pacer. Electronically Signed   By: Corrie Mckusick D.O.   On: 06/06/2018 13:37   Dg Foot 2 Views Left  Result Date: 06/14/2018 CLINICAL DATA:  78 year old male with sepsis EXAM: LEFT FOOT - 2 VIEW COMPARISON:  None. FINDINGS: Lateral view demonstrates nonspecific soft tissue swelling on the forefoot, dorsal region. Dense calcifications of the tibial and pedal vessels. No displaced fracture. No subluxation/dislocation. Degenerative changes of the interphalangeal joints, midfoot, and hindfoot. IMPRESSION: Negative for acute bony abnormality. Nonspecific soft tissue swelling of the dorsal forefoot. Tibial and pedal atherosclerosis Electronically Signed   By: Corrie Mckusick D.O.   On: 06/10/2018 13:38   Ct Extremity Lower Left Wo Contrast  Result Date: 06/13/2018 CLINICAL DATA:  The patient has multiple skin ulceration/lesions on the left leg. The patient suffered a fall several weeks ago resulting in an abrasion on the left lower leg. Increasing redness, pain and swelling. Initial encounter. EXAM: CT OF THE LOWER LEFT EXTREMITY WITHOUT CONTRAST TECHNIQUE: Multidetector CT imaging of the lower left extremity was performed according to the standard protocol. COMPARISON:  Plain films left lower leg and foot 06/08/2018. FINDINGS: Bones/Joint/Cartilage No acute bony or joint abnormality is identified. No periosteal reaction or bony destructive change to suggest osteomyelitis. No hip, knee or ankle effusion. Ligaments Suboptimally assessed by CT. Muscles and Tendons Intact. No intramuscular fluid collection is identified. No gas within muscle or tracking along fascial planes is seen. Fat planes within muscle are preserved. Soft tissues There  is subcutaneous edema about the lower leg which is progressive distally. Cutaneous blisters are seen along  the lateral aspect of the distal lower leg. No focal fluid collection is identified. IMPRESSION: Subcutaneous edema about the left leg is worst distally and at the foot and could be due to dependent change or cellulitis. Skin blisters along the distal aspect of the lateral lower leg noted. Negative for abscess, fasciitis, osteomyelitis or septic joint. Electronically Signed   By: Inge Rise M.D.   On: 06/13/2018 12:49      Code Status History    Date Active Date Inactive Code Status Order ID Comments User Context   07/02/2018 1415 06/20/2018 1500 Full Code [id number]  Germain Osgood, PA-C ED   04/03/2018 1404 04/04/2018 2055 Full Code [id number]  Katherine Roan, MD ED        Assessment & Plan:   ASSESSMENT / PLAN:  PULMONARY A:  Baseline: Non smoker. Not on o2 at thome Acute hypoxemic resp failure at admission with associated bilateral pleural effusions  06/14/2018 -> worsening hypoxemia needing 15L HFNC  P:   Continue o2 Intubate if worse (he is a no cpr only)    NEUROLOGIC A:   Mild intermittent hypoactive delirium P:   Monitor    VASCULAR A:   Circulatory shock due to sepsis from LLE ceullitis  P:  Levophed for MAP > 65 Add midodrine  CARDIAC A: Hx of cAd/s/p CABG  Now with A FIb RVR  P: amio gtt Continue preadmit asa but stop lipitor   INFECTIOUS A:   Severe LLE ceullitis - seen by surgery, confirmed on CT 06/13/2018 P:   Anti-infectives (From admission, onward)   Start     Dose/Rate Route Frequency Ordered Stop   06/13/18 1600  aztreonam (AZACTAM) 2 g in sodium chloride 0.9 % 100 mL IVPB     2 g 200 mL/hr over 30 Minutes Intravenous Every 12 hours 06/13/18 1042     06/13/18 1500  vancomycin (VANCOCIN) IVPB 750 mg/150 ml premix     750 mg 150 mL/hr over 60 Minutes Intravenous Every 24 hours 06/13/18 1042     06/13/18 1400  aztreonam  (AZACTAM) 2 g in sodium chloride 0.9 % 100 mL IVPB  Status:  Discontinued     2 g 200 mL/hr over 30 Minutes Intravenous Every 12 hours 06/13/18 0924 06/13/18 1042   06/13/18 1200  vancomycin (VANCOCIN) IVPB 750 mg/150 ml premix  Status:  Discontinued     750 mg 150 mL/hr over 60 Minutes Intravenous Every 24 hours 06/13/18 0924 06/13/18 1042   06/13/18 0200  aztreonam (AZACTAM) 0.5 g in dextrose 5 % 50 mL IVPB  Status:  Discontinued     0.5 g 100 mL/hr over 30 Minutes Intravenous Every 12 hours 06/29/2018 1359 06/13/18 0924   06/21/2018 1330  vancomycin (VANCOCIN) 1,500 mg in sodium chloride 0.9 % 500 mL IVPB     1,500 mg 250 mL/hr over 120 Minutes Intravenous  Once 06/09/2018 1320 07/01/2018 1749   06/09/2018 1330  aztreonam (AZACTAM) 2 g in sodium chloride 0.9 % 100 mL IVPB     2 g 200 mL/hr over 30 Minutes Intravenous  Once 06/29/2018 1328 06/20/2018 1405       RENAL A:  esrd on HD P:  crrt per renal d5 bic gtt  ELECTROLYTES A:  Mild low phos P: replete   GASTROINTESTINAL A:   Hx of ETOH cirrhosis Able to tak po intermittently  P:   Po intermittently - get swallow eval -> TF if needed SUP Po lactulose  HEMATOLOGIC A:  Severe thrombocytopenia - likely sepsis    P:  - monitor  -use SCD - PRBC for hgb </= 6.9gm%    - exceptions are   -  if ACS susepcted/confirmed then transfuse for hgb </= 8.0gm%,  or    -  active bleeding with hemodynamic instability, then transfuse regardless of hemoglobin value   At at all times try to transfuse 1 unit prbc as possible with exception of active hemorrhage     ENDOCRINE A:   At risk hypo/hypergluyceia  - hypoglucemic overngith  P:   d5 with bic gtt   Best Practice:  Diet: NPO. Pain/Anxiety/Delirium protocol (if indicated): N/A. VAP protocol (if indicated): None. DVT prophylaxis: SCD RLE only.  GI prophylaxis: None. Glucose control: None. Mobility: Bedrest. Code Status: Limited (no CPR). Family Communication: Daughter  updated over the phone 06/13/2018 Disposition: ICU.   ATTESTATION & SIGNATURE   The patient Larry Fitzgerald is critically ill with multiple organ systems failure and requires high complexity decision making for assessment and support, frequent evaluation and titration of therapies, application of advanced monitoring technologies and extensive interpretation of multiple databases.   Critical Care Time devoted to patient care services described in this note is  30  Minutes. This time reflects time of care of this signee Dr Brand Males. This critical care time does not reflect procedure time, or teaching time or supervisory time of PA/NP/Med student/Med Resident etc but could involve care discussion time     Dr. Brand Males, M.D., [facility].C.P Pulmonary and Critical Care Medicine Staff Physician [facility] Pager: [phone], If no answer or between  15:00h - 7:00h: call [phone]  06/14/2018 9:57 AM

## 2018-06-14 NOTE — Progress Notes (Signed)
PT Cancellation Note  Patient Details Name: Larry Fitzgerald MRN: [id number] DOB: 1940/04/23   Cancelled Treatment:    Reason Eval/Treat Not Completed: Patient not medically ready. Pt remains on CRRT. Acute PT to return as able, as appropriate to complete PT eval.  Kittie Plater, PT, DPT Acute Rehabilitation Services Pager #: [phone] Office #: [phone]    Berline Lopes 06/14/2018, 6:51 AM

## 2018-06-14 NOTE — Progress Notes (Signed)
[facility] Progress Note    Assessment/ Plan:   78 y.o.maleESRD TTS, cirrhosis, and CASHD p/w septic shock presumably from cellulitis.He has a left leg wound which has become erythematous and painful over the past few days. Blood pressure was 95M systolic initially; he received IV fluids andwas initiated onlevophed andhe is admitted to the ICU.Nursing reports that he has received 2 liters of fluid. Since getting the fluids he has been able to come down on the levo to 2 mcg/min. Also w/ afib  RVR. Coronavirus negative. Note CABG was complicated by dialysis-dependent AKI since 03/01/2018. He has relocated to [facility] to be closer to family.  Dialysis orders: Emilie Rutter TTS 4hr EDW 65.5 kg 3/2.5  Heparin 2000 Mircera 50 q2wk (has not been rx -> Hb>11)  1) End-stage renal disease -Normally on dialysis TTS   - Currently on  Levophed 13 mcg  - started CRRT on 5/8 w 4K bath Seen on CRRT  500/200/1500 pre/post/qd Thru RIJ TC Keep even now given pressor requirements  Replete phos  2)Septic shock - Felt secondary to cellulitis; ortho did not feel it's compartment syndrome  - Initiated on levophed in the ER; team is working to wean  - Pressors perpulmonology/critical care; currently on Levophed 79mcg - Blood cultures (peripheral) sent (5/7) -> NGT   3)  Acute hypoxic respiratory failure   - Breathing comfortably on high flow  4) Cirrhosis with hepatic encephalopathy + thrombocytopenia - Noted on lactulose  5)  CAD s/p CABG -Off of metoprolol with hypotension  6) Afib with RVR - Off of metoprolol with hypotension - Per primary team    Subjective:   Confused but pleasant Changed filter once overnight   Objective:   BP 102/64   Pulse (!) 117   Temp (!) 96.8 F (36 C) (Axillary)   Resp 19   Wt 71.5 kg   SpO2 (!) 89%   BMI 24.69 kg/m   Intake/Output Summary (Last 24 hours) at 06/14/2018 8413 Last data filed at 06/14/2018  0900 Gross per 24 hour  Intake 3911.16 ml  Output 3565 ml  Net 346.16 ml   Weight change:   Physical Exam: General:chronically ill appearing elderly male, cooperative HEENT:NCAT Heart:tachycardia S1S2; no rub Lungs:clear to ausculation anteriorly; breathing comfortably Abdomen:soft/NT/ND; normal bowel sounds Extremities:2-3+ edema b/l LE  Skin:erythema and warmth of left lower leg Access: right chest ash split catheter  Imaging: Dg Tibia/fibula Left  Result Date: 06/18/2018 CLINICAL DATA:  Possible sepsis. Redness. EXAM: LEFT TIBIA AND FIBULA - 2 VIEW COMPARISON:  None. FINDINGS: There is no evidence of fracture or other focal bone lesions. Marked soft tissue swelling. Vascular calcification. Skeletal osteopenia. IMPRESSION: Marked soft tissue swelling. No fracture. Electronically Signed   By: Staci Righter M.D.   On: 06/11/2018 13:42   Dg Chest Port 1 View  Result Date: 06/13/2018 CLINICAL DATA:  New central line placement EXAM: PORTABLE CHEST 1 VIEW COMPARISON:  06/13/2018 FINDINGS: Left IJ central venous catheter with the tip projecting over the SVC. Dual lumen right-sided central venous catheter with the tip projecting over the cavoatrial junction. There is bilateral small pleural effusions. There is bilateral interstitial and alveolar airspace opacity. There is no pneumothorax. The heart and mediastinal contours are stable. There is evidence of prior CABG. 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 The osseous structures are unremarkable. IMPRESSION: 1. Left jugular central venous catheter with the tip projecting over the SVC 2. CHF. Electronically Signed   By: Kathreen Devoid  On: 06/13/2018 14:12   Dg Chest Port 1 View  Result Date: 06/13/2018 CLINICAL DATA:  Respiratory failure EXAM: PORTABLE CHEST 1 VIEW COMPARISON:  Yesterday FINDINGS: Pleural effusions that are likely large. Cardiomegaly. There has been CABG and left atrial clipping. Dialysis catheter with tip at the right  atrium. Dual-chamber pacer leads from the left. IMPRESSION: Large layering pleural effusions. Electronically Signed   By: Monte Fantasia M.D.   On: 06/13/2018 08:13   Dg Chest Port 1 View  Result Date: 06/21/2018 CLINICAL DATA:  78 year old male with sepsis EXAM: PORTABLE CHEST 1 VIEW COMPARISON:  04/03/2018 FINDINGS: Cardiomediastinal silhouette unchanged with surgical changes of median sternotomy and left atrial clipping. Unchanged right IJ central venous catheter, incompletely imaged. Unchanged left chest wall cardiac pacing device. Low lung volumes. Persisting opacities at the lung bases with obscuration of the bilateral hemidiaphragm and the bilateral heart borders. No pneumothorax. IMPRESSION: Similar appearance of bibasilar pleural effusions with associated atelectasis/consolidation. Surgical changes of median sternotomy and left atrial clipping. Unchanged right IJ hemodialysis catheter and left chest wall cardiac pacer. Electronically Signed   By: Corrie Mckusick D.O.   On: 06/28/2018 13:37   Dg Foot 2 Views Left  Result Date: 06/10/2018 CLINICAL DATA:  78 year old male with sepsis EXAM: LEFT FOOT - 2 VIEW COMPARISON:  None. FINDINGS: Lateral view demonstrates nonspecific soft tissue swelling on the forefoot, dorsal region. Dense calcifications of the tibial and pedal vessels. No displaced fracture. No subluxation/dislocation. Degenerative changes of the interphalangeal joints, midfoot, and hindfoot. IMPRESSION: Negative for acute bony abnormality. Nonspecific soft tissue swelling of the dorsal forefoot. Tibial and pedal atherosclerosis Electronically Signed   By: Corrie Mckusick D.O.   On: 06/20/2018 13:38   Ct Extremity Lower Left Wo Contrast  Result Date: 06/13/2018 CLINICAL DATA:  The patient has multiple skin ulceration/lesions on the left leg. The patient suffered a fall several weeks ago resulting in an abrasion on the left lower leg. Increasing redness, pain and swelling. Initial encounter.  EXAM: CT OF THE LOWER LEFT EXTREMITY WITHOUT CONTRAST TECHNIQUE: Multidetector CT imaging of the lower left extremity was performed according to the standard protocol. COMPARISON:  Plain films left lower leg and foot 06/30/2018. FINDINGS: Bones/Joint/Cartilage No acute bony or joint abnormality is identified. No periosteal reaction or bony destructive change to suggest osteomyelitis. No hip, knee or ankle effusion. Ligaments Suboptimally assessed by CT. Muscles and Tendons Intact. No intramuscular fluid collection is identified. No gas within muscle or tracking along fascial planes is seen. Fat planes within muscle are preserved. Soft tissues There is subcutaneous edema about the lower leg which is progressive distally. Cutaneous blisters are seen along the lateral aspect of the distal lower leg. No focal fluid collection is identified. IMPRESSION: Subcutaneous edema about the left leg is worst distally and at the foot and could be due to dependent change or cellulitis. Skin blisters along the distal aspect of the lateral lower leg noted. Negative for abscess, fasciitis, osteomyelitis or septic joint. Electronically Signed   By: Inge Rise M.D.   On: 06/13/2018 12:49    Labs: BMET Recent Labs  Lab 07/04/2018 1246 06/13/18 0212 06/13/18 0249 06/13/18 1612 06/14/18 0500  NA 133* 133* 132* 132* 134*  K 3.3* 3.9 3.5 3.9 3.5  CL 98 96*  --  97* 95*  CO2 19* 18*  --  21* 20*  GLUCOSE 69* 69*  --  136* 105*  BUN 24* 30*  --  29* 19  CREATININE 4.00* 3.84*  --  3.18* 2.28*  CALCIUM 8.8* 8.7*  --  8.3* 8.3*  PHOS  --  4.2  --  2.5 1.7*   CBC Recent Labs  Lab 06/14/2018 1246 06/13/18 0212 06/13/18 0249 06/14/18 0500  WBC 3.7* 7.8  --  9.1  NEUTROABS 1.8  --   --   --   HGB 13.2 13.2 14.6 12.3*  HCT 43.0 41.7 43.0 39.0  MCV 84.3 85.5  --  81.6  PLT 51* 52*  --  27*    Medications:    . aspirin  81 mg Oral Daily  . atorvastatin  80 mg Oral q1800  . chlorhexidine  15 mL Mouth Rinse BID   . folic acid  1 mg Intravenous Daily  . lactulose  3.4 g Oral BID  . mouth rinse  15 mL Mouth Rinse q12n4p  . thiamine  100 mg Intravenous Daily      Otelia Santee, MD 06/14/2018, 9:27 AM

## 2018-06-14 NOTE — Progress Notes (Addendum)
Orthopaedic Trauma Service (OTS)      Subjective: Patient reports pain as severe and unchanged.  Objective: Current Vitals Blood pressure (!) 82/54, pulse (!) 115, temperature (!) 96.8 F (36 C), temperature source Axillary, resp. rate 17, weight 71.5 kg, SpO2 90 %. Vital signs in last 24 hours: Temp:  [94.1 F (34.5 C)-97.3 F (36.3 C)] 96.8 F (36 C) (05/09 0900) Pulse Rate:  [49-135] 115 (05/09 1030) Resp:  [11-26] 17 (05/09 1030) BP: (69-112)/(43-74) 82/54 (05/09 1030) SpO2:  [80 %-95 %] 90 % (05/09 1030) FiO2 (%):  [40 %-55 %] 55 % (05/08 2000)  Intake/Output from previous day: 05/08 0701 - 05/09 0700 In: 3661.1 [I.V.:3260.8; IV Piggyback:400.3] Out: 3245   LABS Recent Labs    07/06/2018 1246 06/13/18 0212 06/13/18 0249 06/14/18 0500  HGB 13.2 13.2 14.6 12.3*   Recent Labs    06/13/18 0212 06/13/18 0249 06/14/18 0500  WBC 7.8  --  9.1  RBC 4.88  --  4.78  HCT 41.7 43.0 39.0  PLT 52*  --  27*   Recent Labs    06/13/18 1612 06/14/18 0500  NA 132* 134*  K 3.9 3.5  CL 97* 95*  CO2 21* 20*  BUN 29* 19  CREATININE 3.18* 2.28*  GLUCOSE 136* 105*  CALCIUM 8.3* 8.3*   No results for input(s): LABPT, INR in the last 72 hours.   Physical Exam LLE Although initial improvement in erythema from first dose to yesterday pm, no significant change overnight  Very tender  Bullae and ecchymosis nearly circumferential from mid calf down to anterior foot  Associated edema  Sens: DPN, SPN, TN intact  Motor: EHL, FHL, and lessor toe ext and flex all intact grossly  Brisk cap refill, warm to touch, palp pulse  Assessment/Plan:   Left leg cellulites with threatened skin and bullae, questionable improvement with IV abx 1. Continue Abx  2. Daily or BID dressing changes as needed; will get Mepitel to bedside 3. Will discuss amputation with daughter as possible life preserving measure if further deterioration; at this point seems doubtful that a BKA would be able to  heal given extent of skin involvement; I did broach the subject of amputation with the patient today.  Pictures to follow in chart.  Altamese [place], MD [facility], [facility] [phone] (p)   ADDENDUM: I discussed with the daughter Daisy Blossom and her husband the findings and recommendations with respect to her father's leg. I also shared the photos obtained of his leg today. If there is failure to improve, AKA offers the best chance at successful resolution of his infection with the least risk of complications. Medial posterior thigh erythema remains a concern. We will reassess tomorrow. Surgery, if indicated, is anticipated on Monday or Tue.  I have also reached out to Dr. Meridee Score given his expertise in this domain for recommendations and/ or treatment.  Altamese [place], MD [facility], [facility] [phone]

## 2018-06-14 NOTE — Consult Note (Signed)
ORTHOPAEDIC CONSULTATION  REQUESTING PHYSICIAN: Brand Males, MD  Chief Complaint: Painful ischemic ulcers and blisters left leg.  HPI: Larry Fitzgerald is a 78 y.o. male who presents with ischemic changes blisters and cellulitis left leg.  Patient is status post cardiac bypass has had renal insufficiency and is now on dialysis.  Patient is currently on Levophed to maintain blood pressure with his septic shock.  Past Medical History:  Diagnosis Date  . Cancer ([place])    colon ca   . Coronary artery disease   . Liver cirrhosis ([place])   . Renal failure    Past Surgical History:  Procedure Laterality Date  . CARDIAC SURGERY     CABG   . COLON SURGERY     Social History   Socioeconomic History  . Marital status: Divorced    Spouse name: Not on file  . Number of children: Not on file  . Years of education: Not on file  . Highest education level: Not on file  Occupational History  . Not on file  Social Needs  . Financial resource strain: Not on file  . Food insecurity:    Worry: Not on file    Inability: Not on file  . Transportation needs:    Medical: Not on file    Non-medical: Not on file  Tobacco Use  . Smoking status: Never Smoker  . Smokeless tobacco: Never Used  Substance and Sexual Activity  . 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 Drug use: Never  . Sexual activity: Not Currently  Lifestyle  . Physical activity:    Days per week: Not on file    Minutes per session: Not on file  . Stress: Not on file  Relationships  . Social connections:    Talks on phone: Not on file    Gets together: Not on file    Attends religious service: Not on file    Active member of club or organization: Not on file    Attends meetings of clubs or organizations: Not on file    Relationship status: Not on file  Other Topics Concern  . Not on file  Social History Narrative  . Not on file   History reviewed. No pertinent  family history. - negative except otherwise stated in the family history section Allergies  Allergen Reactions  . Penicillins Anaphylaxis    Did it involve swelling oYesf the face/tongue/throat, SOB, or low BP? Yes Did it involve sudden or severe rash/hives, skin peeling, or any reaction on the inside of your mouth or nose?  Did you need to seek medical attention at a hospital or doctor's office? Yes When did it last happen? If all above answers are "NO", may proceed with cephalosporin use.   Prior to Admission medications   Medication Sig Start Date End Date Taking? Authorizing Provider  aspirin EC 81 MG tablet Take 81 mg by mouth daily.   Yes [provider]  atorvastatin (LIPITOR) 80 MG tablet Take 80 mg by mouth daily. 02/19/18  Yes [provider]  citalopram (CELEXA) 20 MG tablet Take 20 mg by mouth daily. 03/13/18 06/14/2018 Yes [provider]  furosemide (LASIX) 40 MG tablet Take 40 mg by mouth daily. 12/17/17  Yes [provider]  lactulose (CHRONULAC) 10 GM/15ML solution Take 3.3 g by mouth 2 (two) times daily. 5 ml twice daily 08/30/17  Yes [provider]  metoprolol tartrate (LOPRESSOR) 25 MG tablet Take 25 mg by mouth 2 (two) times daily. 03/13/18 06/10/2018 Yes [provider]  mirtazapine (REMERON) 15 MG tablet Take 7.5 mg by mouth at bedtime.   Yes [provider]  Misc Natural Products (GLUCOS-CHONDROIT-MSM COMPLEX PO) Take 1 capsule by mouth daily.   Yes [provider]  Multiple Vitamins-Minerals (EQ MULTIVITAMINS ADULT GUMMY) CHEW Chew 1 tablet by mouth daily.   Yes [provider]  omeprazole (PRILOSEC) 20 MG capsule Take 20 mg by mouth daily. 01/20/16  Yes [provider]  sulfamethoxazole-trimethoprim (BACTRIM DS) 800-160 MG tablet Take 1 tablet by mouth 2 (two) times a day. 06/11/18 06/21/18 Yes [provider]   Dg Chest Port 1 View  Result Date: 06/14/2018 CLINICAL DATA:   Acute respiratory failure EXAM: PORTABLE CHEST 1 VIEW COMPARISON:  06/13/2018 FINDINGS: Stable support apparatus. Similar pattern of cardiomegaly, diffuse edema and pleural effusions compatible with persistent CHF. Associated basilar collapse/consolidation, worse on the left. No pneumothorax. Previous median sternotomy noted. Degenerative changes of the spine. Bones are osteopenic. IMPRESSION: Unchanged cardiomegaly, diffuse edema pattern and pleural effusions compatible with CHF. Persistent low lung volumes and bibasilar collapse/consolidation worse on the left Electronically Signed   By: Jerilynn Mages.  Shick M.D.   On: 06/14/2018 12:39   Dg Chest Port 1 View  Result Date: 06/13/2018 CLINICAL DATA:  New central line placement EXAM: PORTABLE CHEST 1 VIEW COMPARISON:  06/13/2018 FINDINGS: Left IJ central venous catheter with the tip projecting over the SVC. Dual lumen right-sided central venous catheter with the tip projecting over the cavoatrial junction. There is bilateral small pleural effusions. There is bilateral interstitial and alveolar airspace opacity. There is no pneumothorax. The heart and mediastinal contours are stable. There is evidence of prior CABG. 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 The osseous structures are unremarkable. IMPRESSION: 1. Left jugular central venous catheter with the tip projecting over the SVC 2. CHF. Electronically Signed   By: Kathreen Devoid   On: 06/13/2018 14:12   Dg Chest Port 1 View  Result Date: 06/13/2018 CLINICAL DATA:  Respiratory failure EXAM: PORTABLE CHEST 1 VIEW COMPARISON:  Yesterday FINDINGS: Pleural effusions that are likely large. Cardiomegaly. There has been CABG and left atrial clipping. Dialysis catheter with tip at the right atrium. Dual-chamber pacer leads from the left. IMPRESSION: Large layering pleural effusions. Electronically Signed   By: Monte Fantasia M.D.   On: 06/13/2018 08:13   Ct Extremity Lower Left Wo Contrast  Result Date: 06/13/2018 CLINICAL  DATA:  The patient has multiple skin ulceration/lesions on the left leg. The patient suffered a fall several weeks ago resulting in an abrasion on the left lower leg. Increasing redness, pain and swelling. Initial encounter. EXAM: CT OF THE LOWER LEFT EXTREMITY WITHOUT CONTRAST TECHNIQUE: Multidetector CT imaging of the lower left extremity was performed according to the standard protocol. COMPARISON:  Plain films left lower leg and foot 06/30/2018. FINDINGS: Bones/Joint/Cartilage No acute bony or joint abnormality is identified. No periosteal reaction or bony destructive change to suggest osteomyelitis. No hip, knee or ankle effusion. Ligaments Suboptimally assessed by CT. Muscles and Tendons Intact. No intramuscular fluid collection is identified. No gas within muscle or tracking along fascial planes is seen. Fat planes within muscle are preserved. Soft tissues There is subcutaneous edema about the lower leg which is progressive distally. Cutaneous blisters are seen along the lateral aspect of the distal lower leg. No focal fluid collection is identified. IMPRESSION: Subcutaneous edema about the  left leg is worst distally and at the foot and could be due to dependent change or cellulitis. Skin blisters along the distal aspect of the lateral lower leg noted. Negative for abscess, fasciitis, osteomyelitis or septic joint. Electronically Signed   By: Inge Rise M.D.   On: 06/13/2018 12:49   - pertinent xrays, CT, MRI studies were reviewed and independently interpreted  Positive ROS: All other systems have been reviewed and were otherwise negative with the exception of those mentioned in the HPI and as above.  Physical Exam: General: Alert, no acute distress Psychiatric: Patient is  Not competent for consent will not answer questions Lymphatic: No axillary or cervical lymphadenopathy Cardiovascular: No pedal edema Respiratory: No cyanosis, no use of accessory musculature GI: No organomegaly, abdomen  is soft and non-tender    Images:  @ENCIMAGES @  Labs:  Lab Results  Component Value Date   REPTSTATUS 06/13/2018 FINAL 06/09/2018   CULT  06/23/2018    NO GROWTH Performed at [facility] [address] [place], [place] [address]     Lab Results  Component Value Date   ALBUMIN 2.3 (L) 06/14/2018   ALBUMIN 2.4 (L) 06/13/2018   ALBUMIN 2.7 (L) 06/27/2018    Neurologic: Patient does not have protective sensation bilateral lower extremities.   MUSCULOSKELETAL:   Skin: Examination patient has blisters and ischemic changes of the entire left leg and foot.  He does have a palpable dorsalis pedis and posterior tibial pulse.  Review of the CT scan shows no radiographic findings of necrotizing fasciitis.  No abscess shows no septic joints.  Assessment: Assessment: Ischemic changes left leg with cellulitis.  Plan: Will follow this expectantly.  With patient's continued requirement for Levophed the chance of limb salvage decreases.  If patient's condition deteriorates would need to consider an urgent above-the-knee amputation.  Thank you for the consult and the opportunity to see Mr. [place], MD [place] [phone] 3:22 PM

## 2018-06-15 ENCOUNTER — Encounter (HOSPITAL_COMMUNITY): Admission: EM | Disposition: E | Payer: Self-pay | Source: Home / Self Care | Attending: Internal Medicine

## 2018-06-15 LAB — RENAL FUNCTION PANEL
Albumin: 2.3 g/dL — ABNORMAL LOW (ref 3.5–5.0)
Anion gap: 18 — ABNORMAL HIGH (ref 5–15)
BUN: 11 mg/dL (ref 8–23)
CO2: 21 mmol/L — ABNORMAL LOW (ref 22–32)
Calcium: 8.2 mg/dL — ABNORMAL LOW (ref 8.9–10.3)
Chloride: 96 mmol/L — ABNORMAL LOW (ref 98–111)
Creatinine, Ser: 1.39 mg/dL — ABNORMAL HIGH (ref 0.61–1.24)
GFR calc Af Amer: 56 mL/min — ABNORMAL LOW (ref >60)
GFR calc non Af Amer: 49 mL/min — ABNORMAL LOW (ref >60)
Glucose, Bld: 112 mg/dL — ABNORMAL HIGH (ref 70–99)
Phosphorus: 1.5 mg/dL — ABNORMAL LOW (ref 2.5–4.6)
Potassium: 3.7 mmol/L (ref 3.5–5.1)
Sodium: 135 mmol/L (ref 135–145)

## 2018-06-15 LAB — BASIC METABOLIC PANEL
Anion gap: 13 (ref 5–15)
BUN: 13 mg/dL (ref 8–23)
CO2: 19 mmol/L — ABNORMAL LOW (ref 22–32)
Calcium: 8 mg/dL — ABNORMAL LOW (ref 8.9–10.3)
Chloride: 99 mmol/L (ref 98–111)
Creatinine, Ser: 1.54 mg/dL — ABNORMAL HIGH (ref 0.61–1.24)
GFR calc Af Amer: 50 mL/min — ABNORMAL LOW (ref >60)
GFR calc non Af Amer: 43 mL/min — ABNORMAL LOW (ref >60)
Glucose, Bld: 118 mg/dL — ABNORMAL HIGH (ref 70–99)
Potassium: 3.5 mmol/L (ref 3.5–5.1)
Sodium: 131 mmol/L — ABNORMAL LOW (ref 135–145)

## 2018-06-15 LAB — CBC WITH DIFFERENTIAL/PLATELET
Abs Immature Granulocytes: 0.1 10*3/uL — ABNORMAL HIGH (ref 0.00–0.07)
Basophils Absolute: 0.1 10*3/uL (ref 0.0–0.1)
Basophils Relative: 1 %
Eosinophils Absolute: 0 10*3/uL (ref 0.0–0.5)
Eosinophils Relative: 0 %
HCT: 37.5 % — ABNORMAL LOW (ref 39.0–52.0)
Hemoglobin: 12 g/dL — ABNORMAL LOW (ref 13.0–17.0)
Immature Granulocytes: 1 %
Lymphocytes Relative: 4 %
Lymphs Abs: 0.6 10*3/uL — ABNORMAL LOW (ref 0.7–4.0)
MCH: 25.8 pg — ABNORMAL LOW (ref 26.0–34.0)
MCHC: 32 g/dL (ref 30.0–36.0)
MCV: 80.6 fL (ref 80.0–100.0)
Monocytes Absolute: 2 10*3/uL — ABNORMAL HIGH (ref 0.1–1.0)
Monocytes Relative: 11 %
Neutro Abs: 14.8 10*3/uL — ABNORMAL HIGH (ref 1.7–7.7)
Neutrophils Relative %: 83 %
Platelets: 14 10*3/uL — CL (ref 150–400)
RBC: 4.65 MIL/uL (ref 4.22–5.81)
RDW: 21.5 % — ABNORMAL HIGH (ref 11.5–15.5)
WBC: 17.7 10*3/uL — ABNORMAL HIGH (ref 4.0–10.5)
nRBC: 1.2 % — ABNORMAL HIGH (ref 0.0–0.2)

## 2018-06-15 LAB — HEPATIC FUNCTION PANEL
ALT: 41 U/L (ref 0–44)
AST: 70 U/L — ABNORMAL HIGH (ref 15–41)
Albumin: 2.3 g/dL — ABNORMAL LOW (ref 3.5–5.0)
Alkaline Phosphatase: 89 U/L (ref 38–126)
Bilirubin, Direct: 3.5 mg/dL — ABNORMAL HIGH (ref 0.0–0.2)
Indirect Bilirubin: 1.9 mg/dL — ABNORMAL HIGH (ref 0.3–0.9)
Total Bilirubin: 5.4 mg/dL — ABNORMAL HIGH (ref 0.3–1.2)
Total Protein: 5.4 g/dL — ABNORMAL LOW (ref 6.5–8.1)

## 2018-06-15 LAB — LACTIC ACID, PLASMA: Lactic Acid, Venous: 6.5 mmol/L (ref 0.5–1.9)

## 2018-06-15 LAB — GLUCOSE, CAPILLARY
Glucose-Capillary: 113 mg/dL — ABNORMAL HIGH (ref 70–99)
Glucose-Capillary: 91 mg/dL (ref 70–99)
Glucose-Capillary: 79 mg/dL (ref 70–99)

## 2018-06-15 LAB — PROTIME-INR
INR: 2.7 — ABNORMAL HIGH (ref 0.8–1.2)
Prothrombin Time: 28.6 seconds — ABNORMAL HIGH (ref 11.4–15.2)

## 2018-06-15 LAB — MAGNESIUM: Magnesium: 2.3 mg/dL (ref 1.7–2.4)

## 2018-06-15 SURGERY — AMPUTATION, ABOVE KNEE
Anesthesia: General | Laterality: Left

## 2018-06-15 MED ORDER — SODIUM PHOSPHATES 45 MMOLE/15ML IV SOLN
30.0000 mmol | Freq: Once | INTRAVENOUS | Status: AC
  Administered 2018-06-15: 30 mmol via INTRAVENOUS
  Filled 2018-06-15: qty 10

## 2018-06-15 MED ORDER — FENTANYL CITRATE (PF) 100 MCG/2ML IJ SOLN: 25.0000 ug | INTRAMUSCULAR | Status: DC | PRN

## 2018-06-15 MED ORDER — POTASSIUM PHOSPHATES 15 MMOLE/5ML IV SOLN: 30.0000 mmol | Freq: Once | INTRAVENOUS | Status: DC

## 2018-06-17 ENCOUNTER — Telehealth: Payer: Self-pay

## 2018-06-17 LAB — CULTURE, BLOOD (ROUTINE X 2)
Culture: NO GROWTH
Culture: NO GROWTH
Special Requests: ADEQUATE

## 2018-06-17 NOTE — Telephone Encounter (Signed)
Received dc from [employer]. (original copy for cremation).  Patient is a patient of [facility].  DC will be taken to [place] for signature..  On 06/18/2018 Received signed dc back from Doctor Ramaswamy. I called the funeral home to let them know dc was ready for pickup. I also faxed a copy to the funeral home per their request.

## 2018-06-23 ENCOUNTER — Encounter: Payer: Medicare Other | Admitting: Internal Medicine

## 2018-07-07 NOTE — Progress Notes (Signed)
Assisted tele visit to patient with daughter.  Emanii Bugbee M, RN  

## 2018-07-07 NOTE — Progress Notes (Signed)
Spoke with M.E. Izora Ribas.  Stated patient will not be a medical examiner case.

## 2018-07-07 NOTE — Discharge Summary (Signed)
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 patient with    has a past medical history of Cancer ([place]), Coronary artery disease, Liver cirrhosis ([place]), and Renal failure.   has a past surgical history that includes Cardiac surgery and Colon surgery.   Admitted on 06/29/2018 with    At baseline, he is from [place] but currently lives with his daughter. He had bypass surgery in January. After that surgery he had to have a pacemaker placed. He had renal failure during that admit and now is on HD (T/Th/S). He previously had high blood pressure before heart surgery and now runs with a systolic of 784 typically. The patient fell approximately 4 weeks prior to admit. Normally is independent of ADL's. Daughter had been managing wounds at home and it was getting better. Daughter reports she thought it had healed and then he showed her the wound two days before admit and it looked worse. He reported pain in his right lower leg. No fevers, chills, n/v/d. Since heart surgery, he "breathes differently" but is not worse than normal. Patient more confused the am of admit. They had a video call with MD on 5/6 for RLE and he was given bactrim and had taken 3 doses. Reports he has poor PO intake. Daughter reports he weighs 142-146 and is stable.   78 y/o M who presented to [facility] on 5/7 with reports of fall and concern for cellulitis of left shin. EMS report noted he had fallen several weeks ago and injured his left shin. His family had been attempting to care for it at  home but he had increasing redness, pain and warmth. He reportedly fell again the evening prior to presentation.   On presentation he was noted to be hypotensive with systolic in the 69'G, in [facility] with a normal mental status per staff. UA worrisome for possible UTI but patient is anuric. Labs notable for Na 133, K 3.3, Cl 98, CO2 19, glucose 69, BUN 24, Sr Cr 4, AG 16, AST 65 / ALT 37, WBC 3.7, Hgb 13.2 and platelets 51. CXR notable for bilateral pleural effusions.  Of note, baseline BP is high 90s to low 100s.   Significant Hospital Events   5/7 > admit. 5/8 > CRRT.  5/9 - worsening hypoxemia to 16L HFNC . LEvophed at 13. On amio gtt. Not on vent. Mild hypoactive intermittent delirium +. LLE very painful - conservative Rx per CCS. CRRT contnues   5/10 - Dr Marcelino Scot recommended amputation. Daughter declined this and prefers hospice. Patient with hypoactive delirium and is unsure but to this MD true capacity to make this determination seems crircumspect. Levophed and CRRT and HFNC continue. Daughter Larry Fitzgerald does NOT think he is stable enough to make it home alive. Family conversation had at bedside. And decision made for comfort care.   Patient expired 25-Jun-2018   SIGNED Dr. Brand Males, M.D., F.C.C.P Pulmonary and Critical Care Medicine Staff Physician [facility] Pager: [phone], If no answer  or between  15:00h - 7:00h: call [phone]  07/02/2018 2:23 PM

## 2018-07-07 NOTE — Consult Note (Signed)
Responded to nurse's page earlier this morning, who briefed me on pt's status, said pt's daughter was arriving  after 1 pm, and asked me to come by after that. Agreed, asked her to page again when daughter was bedside. When nurse did, she said daughter had declined to have chaplain visit because family was "not religious." Understood, and let nurse know please page again if daughter changes her mind.   Rev. Eloise Levels Chaplain

## 2018-07-07 NOTE — Progress Notes (Signed)
SLP Cancellation Note  Patient Details Name: Sanay Belmar MRN: [id number] DOB: [birth year]   Cancelled treatment:       Reason Eval/Treat Not Completed: Patient not medically ready. Going for AKA today. Will f/u   Kessler Solly, Katherene Ponto Jun 19, 2018, 11:24 AM

## 2018-07-07 NOTE — Progress Notes (Signed)
CRITICAL VALUE ALERT  Critical Value:  Lactic acid= 6.5  Date & Time Notied:  2018/07/03  Provider Notified: Dr Johnette Abraham. [place]  Orders Received/Actions taken: No new orders at this time

## 2018-07-07 NOTE — Progress Notes (Signed)
CDS referral initiated. The number is [id number].

## 2018-07-07 NOTE — Progress Notes (Signed)
I spoke with the patient and explained that AKA was recommended to preserve life and overcome his infection. He did not consent to treatment. I spoke with his daughter at length this am, who had herself last night performed a family conference to discuss AKA. Based on her and the family's understanding of her father's wishes before this episode of illness, she did not consent to AKA either and instead wished for hospice consultation.  I conferred with both Dr. Sharol Given and Dr. Chase Caller who further endorsed this decision for hospice as a reasonable option.  I have informed the OR.  Video conferencing has been set up with the daughter for now and CCM will contact her to discuss and/ or arrange end of life visit.  Altamese , MD [facility], [facility] [phone]

## 2018-07-07 NOTE — Progress Notes (Signed)
At [place] Patient pronounced by Rogelia Boga and Shea Stakes, RN. No respirations noted, no pupillary response, no heart tones. Family present at bedside, Attending MD notified.

## 2018-07-07 NOTE — Progress Notes (Signed)
[facility] Progress Note    Assessment/ Plan:   78 y.o.maleESRDTTS, cirrhosis, andCASHD p/w septic shock presumably fromcellulitis.He has a left leg wound which has become erythematous and painful over the past few days. Blood pressure was 16R systolic initially; he received IV fluids andwas initiated onlevophed andhe is admitted to the ICU.Nursing reports that he has received 2 liters of fluid. Since getting the fluids he has been able to come down on the levo to 2 mcg/min.Alsow/afib RVR. Coronavirus negative. Note CABG was complicated by dialysis-dependent AKI since 03/01/2018. He has relocated to [facility] to be closer to family.  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  basically net even over the 24hrs and ~+1654 during the hospitalization  - Currently on  Levophed 17 mcg  - started CRRTon 5/8 w 4K bath Seen on CRRT  500/200/1500 pre/post/qd Thru RIJ TC Oxygenation requirements increasing -> will try for 50-100 ml/hr UF  Replete phos - sodiumphos 56mmol to be given 5/10   2)Septic shock - Felt secondary to cellulitis; ortho did not feel it's compartment syndrome  - Levophed requirements increasing 32mcg and now 23mcg - Blood cultures (peripheral) sent(5/7) -> NGT  3)Acute hypoxic respiratory failure   - Breathing comfortablyon high flow  4)Cirrhosis with hepatic encephalopathy + thrombocytopenia - Noted on lactulose  5)CAD s/p CABG -Off of metoprolol with hypotension  6)Afib with RVR - Off of metoprolol with hypotension - Per primary team  Subjective:   Confused but pleasant Changed filter once overnight C/o thirst and left leg pain   Objective:   BP (!) 77/48   Pulse (!) 118   Temp (!) 96.5 F (35.8 C) (Axillary)   Resp (!) 28   Wt 73.2 kg   SpO2 91%   BMI 25.28  kg/m   Intake/Output Summary (Last 24 hours) at 27-Jun-2018 0857 Last data filed at 27-Jun-2018 0800 Gross per 24 hour  Intake 4830.25 ml  Output 4870 ml  Net -39.75 ml   Weight change:   Physical Exam: General:chronically ill appearing elderly male, cooperative HEENT:NCAT Heart:tachycardia S1S2; no rub Lungs:clear to ausculation anteriorly; breathing comfortably Abdomen:soft/NT/ND; normal bowel sounds Extremities:2-3+ edema b/l LE  Skin:erythema and warmth of left lower leg Access: right chest ash split catheter  Imaging: Dg Chest Port 1 View  Result Date: 06/14/2018 CLINICAL DATA:  Acute respiratory failure EXAM: PORTABLE CHEST 1 VIEW COMPARISON:  06/13/2018 FINDINGS: Stable support apparatus. Similar pattern of cardiomegaly, diffuse edema and pleural effusions compatible with persistent CHF. Associated basilar collapse/consolidation, worse on the left. No pneumothorax. Previous median sternotomy noted. Degenerative changes of the spine. Bones are osteopenic. IMPRESSION: Unchanged cardiomegaly, diffuse edema pattern and pleural effusions compatible with CHF. Persistent low lung volumes and bibasilar collapse/consolidation worse on the left Electronically Signed   By: Jerilynn Mages.  Shick M.D.   On: 06/14/2018 12:39   Dg Chest Port 1 View  Result Date: 06/13/2018 CLINICAL DATA:  New central line placement EXAM: PORTABLE CHEST 1 VIEW COMPARISON:  06/13/2018 FINDINGS: Left IJ central venous catheter with the tip projecting over the SVC. Dual lumen right-sided central venous catheter with the tip projecting over the cavoatrial junction. There is bilateral small pleural effusions. There is bilateral interstitial and alveolar airspace opacity. There is no pneumothorax. The heart and mediastinal contours are stable. There is evidence of prior CABG. 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 The  osseous structures are unremarkable. IMPRESSION: 1. Left jugular central venous catheter with the tip  projecting over the SVC 2. CHF. Electronically Signed   By: Kathreen Devoid   On: 06/13/2018 14:12   Ct Extremity Lower Left Wo Contrast  Result Date: 06/13/2018 CLINICAL DATA:  The patient has multiple skin ulceration/lesions on the left leg. The patient suffered a fall several weeks ago resulting in an abrasion on the left lower leg. Increasing redness, pain and swelling. Initial encounter. EXAM: CT OF THE LOWER LEFT EXTREMITY WITHOUT CONTRAST TECHNIQUE: Multidetector CT imaging of the lower left extremity was performed according to the standard protocol. COMPARISON:  Plain films left lower leg and foot 06/17/2018. FINDINGS: Bones/Joint/Cartilage No acute bony or joint abnormality is identified. No periosteal reaction or bony destructive change to suggest osteomyelitis. No hip, knee or ankle effusion. Ligaments Suboptimally assessed by CT. Muscles and Tendons Intact. No intramuscular fluid collection is identified. No gas within muscle or tracking along fascial planes is seen. Fat planes within muscle are preserved. Soft tissues There is subcutaneous edema about the lower leg which is progressive distally. Cutaneous blisters are seen along the lateral aspect of the distal lower leg. No focal fluid collection is identified. IMPRESSION: Subcutaneous edema about the left leg is worst distally and at the foot and could be due to dependent change or cellulitis. Skin blisters along the distal aspect of the lateral lower leg noted. Negative for abscess, fasciitis, osteomyelitis or septic joint. Electronically Signed   By: Inge Rise M.D.   On: 06/13/2018 12:49    Labs: BMET Recent Labs  Lab 06/22/2018 1246 06/13/18 0212 06/13/18 0249 06/13/18 1612 06/14/18 0500 06/14/18 1542 06/14/18 2343 July 12, 2018 0448  NA 133* 133* 132* 132* 134* 132* 131* 135  K 3.3* 3.9 3.5 3.9 3.5 3.5 3.5 3.7  CL 98 96*  --  97* 95* 98 99 96*  CO2 19* 18*  --  21* 20* 20* 19* 21*  GLUCOSE 69* 69*  --  136* 105* 149* 118* 112*   BUN 24* 30*  --  29* 19 15 13 11   CREATININE 4.00* 3.84*  --  3.18* 2.28* 1.70* 1.54* 1.39*  CALCIUM 8.8* 8.7*  --  8.3* 8.3* 8.1* 8.0* 8.2*  PHOS  --  4.2  --  2.5 1.7* 2.2*  --  1.5*   CBC Recent Labs  Lab 07/02/2018 1246 06/13/18 0212 06/13/18 0249 06/14/18 0500 July 12, 2018 0448  WBC 3.7* 7.8  --  9.1 17.7*  NEUTROABS 1.8  --   --   --  14.8*  HGB 13.2 13.2 14.6 12.3* 12.0*  HCT 43.0 41.7 43.0 39.0 37.5*  MCV 84.3 85.5  --  81.6 80.6  PLT 51* 52*  --  27* 14*    Medications:    . aspirin  81 mg Oral Daily  . chlorhexidine  15 mL Mouth Rinse BID  . famotidine  20 mg Oral QHS  . folic acid  1 mg Intravenous Daily  . lactulose  3.4 g Oral BID  . mouth rinse  15 mL Mouth Rinse q12n4p  . midodrine  10 mg Oral TID WC  . thiamine  100 mg Intravenous Daily      Otelia Santee, MD 07/12/2018, 8:57 AM

## 2018-07-07 NOTE — Progress Notes (Signed)
Orthopaedic Trauma Service (OTS)    Greatly appreciate Dr. Jess Barters consultation and plan for management.   Subjective: Patient reports pain as severe.    Objective: Current Vitals Blood pressure (!) 93/47, pulse (!) 112, temperature (!) 96.5 F (35.8 C), temperature source Axillary, resp. rate (!) 23, weight 73.2 kg, SpO2 91 %. Vital signs in last 24 hours: Temp:  [96.5 F (35.8 C)-97.2 F (36.2 C)] 96.5 F (35.8 C) (05/10 0803) Pulse Rate:  [110-134] 112 (05/10 0900) Resp:  [16-30] 23 (05/10 0900) BP: (73-164)/(26-111) 93/47 (05/10 0900) SpO2:  [86 %-95 %] 91 % (05/10 0900) Weight:  [73.2 kg] 73.2 kg (05/10 0400)  Intake/Output from previous day: 05/09 0701 - 05/10 0700 In: 4805.9 [P.O.:90; I.V.:4091.6; IV Piggyback:613.1] Out: 4832   LABS Recent Labs    06/21/2018 1246 06/13/18 0212 06/13/18 0249 06/14/18 0500 07-14-18 0448  HGB 13.2 13.2 14.6 12.3* 12.0*   Recent Labs    06/14/18 0500 14-Jul-2018 0448  WBC 9.1 17.7*  RBC 4.78 4.65  HCT 39.0 37.5*  PLT 27* 14*   Recent Labs    06/14/18 2343 07-14-18 0448  NA 131* 135  K 3.5 3.7  CL 99 96*  CO2 19* 21*  BUN 13 11  CREATININE 1.54* 1.39*  GLUCOSE 118* 112*  CALCIUM 8.0* 8.2*   Recent Labs    07-14-18 0448  INR 2.7*     Physical Exam On high O2--Difficulty speaking in full sentences but can cough Stool present in the bed LLE Demarcation Dressing intact, clean, dry  Edema/ swelling persistent  Black, necrotic skin nearly circumferential   Brisk cap refill, warm to touch  Assessment/Plan:   Worsening clinical picture with increasing pressor requirements.  I am recommending AKA today by myself or Dr. Sharol Given. I have conferred with Renal and Critical Care MD's, both of whom endorse that plan.  Patient made NPO. I am calling the daughter at this time.  Altamese [place], MD [facility], [facility] [phone] (p)   ADDENDUM: I spoke with Dr. Sharol Given who plans to perform left  AKA later today.

## 2018-07-07 NOTE — Progress Notes (Signed)
NAME:  Larry Fitzgerald, MRN:  [id number], DOB:  [birth year], LOS: 3 ADMISSION DATE:  06/26/2018, CONSULTATION DATE:  06/20/2018 REFERRING MD:  Ronnald Nian  CHIEF COMPLAINT:  Falls   Brief History   .  At baseline, he is from [place] but currently lives with his daughter.  He had bypass surgery in January. After that surgery he had to have a pacemaker placed. He had renal failure during that admit and now is on HD (T/Th/S).  He previously had high blood pressure before heart surgery and now runs with a systolic of 097 typically.  The patient fell approximately 4 weeks prior to admit.  Normally is independent of ADL's.  Daughter had been managing wounds at home and it was getting better.  Daughter reports she thought it had healed and then he showed her the wound two days before admit and it looked worse.  He reported pain in his right lower leg.  No fevers, chills, n/v/d.  Since heart surgery, he "breathes differently" but is not worse than normal.  Patient more confused the am of admit.  They had a video call with MD on 5/6 for RLE and he was given bactrim and had taken 3 doses.  Reports he has poor PO intake.  Daughter reports he weighs 142-146 and is stable.    78 y/o M who presented to [facility] on 5[facility]7 with reports of fall and concern for cellulitis of left shin. EMS report noted he had fallen several weeks ago and injured his left shin.  His family had been attempting to care for it at home but he had increasing redness, pain and warmth.  He reportedly fell again the evening prior to presentation.    On presentation he was noted to be hypotensive with systolic in the 35'H, in [facility] with a normal mental status per staff.  UA worrisome for possible UTI but patient is anuric. Labs notable for Na 133, K 3.3, Cl 98, CO2 19, glucose 69, BUN 24, Sr Cr 4, AG 16, AST 65 / ALT 37, WBC 3.7, Hgb 13.2 and platelets 51. CXR notable for bilateral pleural effusions.  Of note, baseline BP is high 90s to low 100s.   EVENTS    5/8 - Breathing comfortably.  Having pain from LLE, very painful to touch.   Past Medical History  ESRD on HD TTS, cirrhosis, CAD s/p CABG, PPM, colon CA.  reports that he has never smoked. He has never used smokeless tobacco.   Significant Hospital Events   5/7 > admit. 5/8 > CRRT.  5/9 - worsening hypoxemia to 16L HFNC . LEvophed at 13. On amio gtt. Not on vent. Mild hypoactive intermittent delirium +. LLE very painful - conservative Rx per [facility]. CRRT contnues  Consults:  Nephrology.  Procedures:  None.  Significant Diagnostic Tests:  CXR 5/7 > bibasilar atelectasis. Left foot/tib/fib 5/7 > soft tissue swelling.  Micro Data:  Blood 5/7 >  Urine 5/7 >   Antimicrobials:  Vanc 5/7 >  Aztreonam 5/7 >    Interim history/subjective:    5/10 - Dr Marcelino Scot recommended amputation. Daughter declined this and prefers hospice. Patient with hypoactive delirium and is unsure but to this MD true capacity to make this determination seems crircumspect. Levophed and CRRT and HFNC continue. Daughter Lynelle Smoke does NOT think he is stable enough to make it home alive  Objective:  Blood pressure (!) 80/66, pulse (!) 115, temperature (!) 96.5 F (35.8 C), temperature source Axillary, resp. rate (!) 25, weight  73.2 kg, SpO2 (!) 88 %.        Intake/Output Summary (Last 24 hours) at 07-08-18 1057 Last data filed at 08-Jul-2018 1000 Gross per 24 hour  Intake 4851.48 ml  Output 4941 ml  Net -89.52 ml   Filed Weights   07/04/2018 1549 06/13/18 0212 Jul 08, 2018 0400  Weight: 70.7 kg 71.5 kg 73.2 kg     General Appearance: cachectic. Moans in pain periodically Head:  Normocephalic, without obvious abnormality, atraumatic Eyes:  PERRL - yes, conjunctiva/corneas - muddy     Ears:  Normal external ear canals, both ears Nose:  G tube - no but has [place] Throat:  ETT TUBE - n OG tube - no Neck:  Supple,  No enlargement/tenderness/nodules Lungs: barrell chest . No distress other than mild Heart:  S1  and S2 normal, no murmur, CVP - x.  Pressors - levophed Abdomen:  Soft, no masses, no organomegaly Genitalia / Rectal:  Not done Extremities:  Extremities- intact but LLE severe cellultis Skin:  ntact in exposed areas . Sacral area - not examined Neurologic:  Sedation - none -> RASS - +1 . Moves all 4s - yes. CAM-ICU - mildly positive for delirium . Orientation - partial only       LABS    PULMONARY Recent Labs  Lab 06/13/18 0249 06/13/18 2140  PHART 7.270* 7.341*  PCO2ART 47.7 39.1  PO2ART 63.0* 49.2*  HCO3 22.1 20.8  TCO2 24  --   O2SAT 89.0 84.4    CBC Recent Labs  Lab 06/13/18 0212 06/13/18 0249 06/14/18 0500 07-08-18 0448  HGB 13.2 14.6 12.3* 12.0*  HCT 41.7 43.0 39.0 37.5*  WBC 7.8  --  9.1 17.7*  PLT 52*  --  27* 14*    COAGULATION Recent Labs  Lab 08-Jul-2018 0448  INR 2.7*    CARDIAC  No results for input(s): TROPONINI in the last 168 hours. No results for input(s): PROBNP in the last 168 hours.   CHEMISTRY Recent Labs  Lab 06/13/18 0212  06/13/18 1612 06/14/18 0500 06/14/18 1542 06/14/18 2343 07-08-2018 0448  NA 133*   < > 132* 134* 132* 131* 135  K 3.9   < > 3.9 3.5 3.5 3.5 3.7  CL 96*  --  97* 95* 98 99 96*  CO2 18*  --  21* 20* 20* 19* 21*  GLUCOSE 69*  --  136* 105* 149* 118* 112*  BUN 30*  --  29* 19 15 13 11   CREATININE 3.84*  --  3.18* 2.28* 1.70* 1.54* 1.39*  CALCIUM 8.7*  --  8.3* 8.3* 8.1* 8.0* 8.2*  MG 1.8  --   --  2.2  --   --  2.3  PHOS 4.2  --  2.5 1.7* 2.2*  --  1.5*   < > = values in this interval not displayed.   Estimated Creatinine Clearance: 41.6 mL/min (A) (by C-G formula based on SCr of 1.39 mg/dL (H)).   LIVER Recent Labs  Lab 07/06/2018 1246 06/13/18 1612 06/14/18 0500 06/14/18 1542 July 08, 2018 0448  AST 65*  --   --   --  70*  ALT 37  --   --   --  41  ALKPHOS 65  --   --   --  89  BILITOT 1.9*  --   --   --  5.4*  PROT 6.1*  --   --   --  5.4*  ALBUMIN 2.7* 2.4* 2.3* 2.3* 2.3*  2.3*  INR  --   --    --   --  2.7*     INFECTIOUS Recent Labs  Lab 06/17/2018 1246  06/10/2018 1840 06/13/18 1110 06/14/18 2343  LATICACIDVEN 5.3*   < > 4.1* 4.4* 6.5*  PROCALCITON 26.25  --   --   --   --    < > = values in this interval not displayed.     ENDOCRINE CBG (last 3)  Recent Labs    06/14/18 2346 Jun 24, 2018 0429 06/24/2018 0703  GLUCAP 112* 113* 91         IMAGING x48h  - image(s) personally visualized  -   highlighted in bold Dg Chest Port 1 View  Result Date: 06/14/2018 CLINICAL DATA:  Acute respiratory failure EXAM: PORTABLE CHEST 1 VIEW COMPARISON:  06/13/2018 FINDINGS: Stable support apparatus. Similar pattern of cardiomegaly, diffuse edema and pleural effusions compatible with persistent CHF. Associated basilar collapse/consolidation, worse on the left. No pneumothorax. Previous median sternotomy noted. Degenerative changes of the spine. Bones are osteopenic. IMPRESSION: Unchanged cardiomegaly, diffuse edema pattern and pleural effusions compatible with CHF. Persistent low lung volumes and bibasilar collapse/consolidation worse on the left Electronically Signed   By: Jerilynn Mages.  Shick M.D.   On: 06/14/2018 12:39   Dg Chest Port 1 View  Result Date: 06/13/2018 CLINICAL DATA:  New central line placement EXAM: PORTABLE CHEST 1 VIEW COMPARISON:  06/13/2018 FINDINGS: Left IJ central venous catheter with the tip projecting over the SVC. Dual lumen right-sided central venous catheter with the tip projecting over the cavoatrial junction. There is bilateral small pleural effusions. There is bilateral interstitial and alveolar airspace opacity. There is no pneumothorax. The heart and mediastinal contours are stable. There is evidence of prior CABG. 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 The osseous structures are unremarkable. IMPRESSION: 1. Left jugular central venous catheter with the tip projecting over the SVC 2. CHF. Electronically Signed   By: Kathreen Devoid   On: 06/13/2018 14:12   Ct Extremity  Lower Left Wo Contrast  Result Date: 06/13/2018 CLINICAL DATA:  The patient has multiple skin ulceration/lesions on the left leg. The patient suffered a fall several weeks ago resulting in an abrasion on the left lower leg. Increasing redness, pain and swelling. Initial encounter. EXAM: CT OF THE LOWER LEFT EXTREMITY WITHOUT CONTRAST TECHNIQUE: Multidetector CT imaging of the lower left extremity was performed according to the standard protocol. COMPARISON:  Plain films left lower leg and foot 06/13/2018. FINDINGS: Bones/Joint/Cartilage No acute bony or joint abnormality is identified. No periosteal reaction or bony destructive change to suggest osteomyelitis. No hip, knee or ankle effusion. Ligaments Suboptimally assessed by CT. Muscles and Tendons Intact. No intramuscular fluid collection is identified. No gas within muscle or tracking along fascial planes is seen. Fat planes within muscle are preserved. Soft tissues There is subcutaneous edema about the lower leg which is progressive distally. Cutaneous blisters are seen along the lateral aspect of the distal lower leg. No focal fluid collection is identified. IMPRESSION: Subcutaneous edema about the left leg is worst distally and at the foot and could be due to dependent change or cellulitis. Skin blisters along the distal aspect of the lateral lower leg noted. Negative for abscess, fasciitis, osteomyelitis or septic joint. Electronically Signed   By: Inge Rise M.D.   On: 06/13/2018 12:49      Code Status History    Date Active Date Inactive Code Status Order ID Comments User Context   06/26/2018 1415 06/10/2018 1500 Full Code [id number]  Germain Osgood, PA-C ED   04/03/2018 1404 04/04/2018  2055 Full Code [id number]  Katherine Roan, MD ED        Assessment & Plan:   ASSESSMENT / PLAN:  PULMONARY A:  Baseline: Non smoker. 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, confirmed on CT 06/13/2018./ Meets indication for amputation  P: No amputation per daughter    Anti-infectives (From admission, onward)   Start     Dose/Rate Route Frequency Ordered Stop   06/13/18 1600  aztreonam (AZACTAM) 2 g in sodium chloride 0.9 % 100 mL IVPB     2 g 200 mL/hr over 30 Minutes Intravenous Every 12 hours 06/13/18 1042     06/13/18 1500  vancomycin (VANCOCIN) IVPB 750 mg/150 ml premix     750 mg 150 mL/hr over 60 Minutes Intravenous Every 24 hours 06/13/18 1042     06/13/18 1400  aztreonam (AZACTAM) 2 g in sodium chloride 0.9 % 100 mL IVPB  Status:  Discontinued     2 g 200 mL/hr over 30 Minutes Intravenous Every 12 hours 06/13/18 0924 06/13/18 1042   06/13/18 1200  vancomycin (VANCOCIN) IVPB 750 mg/150 ml premix  Status:  Discontinued     750 mg 150 mL/hr over 60 Minutes Intravenous Every 24 hours 06/13/18 0924 06/13/18 1042   06/13/18 0200  aztreonam (AZACTAM) 0.5 g in dextrose 5 % 50 mL IVPB  Status:  Discontinued     0.5 g 100 mL/hr over 30 Minutes Intravenous Every 12 hours 06/09/2018 1359 06/13/18 0924   07/01/2018 1330  vancomycin (VANCOCIN) 1,500 mg in sodium chloride 0.9 % 500 mL IVPB     1,500 mg 250 mL/hr over 120 Minutes Intravenous  Once 06/19/2018 1320 06/24/2018 1749   06/11/2018 1330  aztreonam (AZACTAM) 2 g in sodium chloride 0.9 % 100 mL IVPB     2 g 200 mL/hr over 30 Minutes Intravenous  Once 06/07/2018 1328 06/06/2018 1405       RENAL A:  esrd on HD P:  crrt per renal d5 bic gtt   ELECTROLYTES A:  Low phos repleted P: monitor   GASTROINTESTINAL A:   Hx of ETOH cirrhosis Able to tak po intermittently  P:   Po intermittently - get swallow eval -> TF if needed SUP Po lactulose  HEMATOLOGIC A:  Severe thrombocytopenia - likely sepsis   06-29-2018 - worse   P:  - monitor  -use SCD - dc aspirin - PRBC for hgb </= 6.9gm%    - exceptions are   -  if ACS susepcted/confirmed then transfuse for hgb </= 8.0gm%,  or    -  active bleeding with hemodynamic instability, then transfuse regardless of hemoglobin value   At at all times try to transfuse 1 unit prbc as possible with exception of active hemorrhage     ENDOCRINE A:   At risk hypo/hypergluyceia   P:   d5 with bic  gtt   Best Practice:  Diet: NPO. Pain/Anxiety/Delirium protocol (if indicated): N/A. VAP protocol (if indicated): None. DVT prophylaxis: SCD RLE only.  GI prophylaxis: None. Glucose control: None. Mobility: Bedrest. Code Status: Limited (no CPR). Family Communication: d/w Daughter Wonda Cheng 06/28/2018   Discussed medicare hospice benefit  - a medicare paid benefit  - for people with terminal qualifying  illness such as IPF, COPD, cancer with statistical prognosis < 6 months for which there is no cure - utilization of hospice shows people live longer paradoxically than those without hospice due to improved attention  - explained hospice locations - home, residential etc.,   - explained respite care options for caregivers  - explained that she could still get treatment for non-hospice diagnosis and still come to office to see me for hospice related diagnosis that i provide support for  - explained that hospice provides nursing, MD, chaplain, volunteer and medications and supplies paid through medicare  - based on all this she feels and we agreed that patient is terminal care end of hospice care. She and her husband will visit [facility] 2018-06-28 and then transition to  full comfort. DNR now    Disposition: ICU   ATTESTATION & SIGNATURE   The patient Travelle Mcclimans is critically ill with multiple organ systems failure and requires high complexity decision making for assessment and support, frequent evaluation and titration of therapies, application of advanced monitoring technologies and extensive interpretation of multiple databases.   Critical Care Time devoted to patient care services described in this note is  30  Minutes. This time reflects time of care of this signee Dr Brand Males. This critical care time does not reflect procedure time, or teaching time or supervisory time of PA/NP/Med student/Med Resident etc but could involve care discussion time     Dr. Brand Males, M.D., [facility].C.P Pulmonary and Critical Care Medicine Staff Physician [place] [facility] Pager: [phone], If no answer or between  15:00h - 7:00h: call [phone]  06-28-2018 10:57 AM

## 2018-07-07 NOTE — Progress Notes (Addendum)
Daughter Tammy came to bedside with her husband  Says current condition is not c/w patient wishes for quality of life She feels he will die with or without surgery  A Terminal care  Plan - dc crrt -dc pressors  - dc labs  - continue abx - contiue oral feeds - increase palliation with morphine prn and if needed drip as he declines  Move to palliative unit and IMTS primary from 06/16/18 and ccm off - d/w resident DR Angelia Mould  Prognosis - hours to days       SIGNATURE    Dr. Brand Males, M.D., F.C.C.P,  Pulmonary and Critical Care Medicine Staff Physician, [place] Director - Interstitial Lung Disease  Program  Pulmonary [place] [facility] [place], [place], [address]  Pager: [phone], If no answer or between  15:00h - 7:00h: call [phone] Telephone: [phone]  3:51 PM 2018/06/20

## 2018-07-07 DEATH — deceased

## 2020-03-02 IMAGING — DX LEFT TIBIA AND FIBULA - 2 VIEW
4 series · 4 of 4 positions shown · non-contrast
Comparison: None.

CLINICAL DATA: Possible sepsis. Redness.

EXAM:
LEFT TIBIA AND FIBULA - 2 VIEW

[tibia ap (1 of 2)]
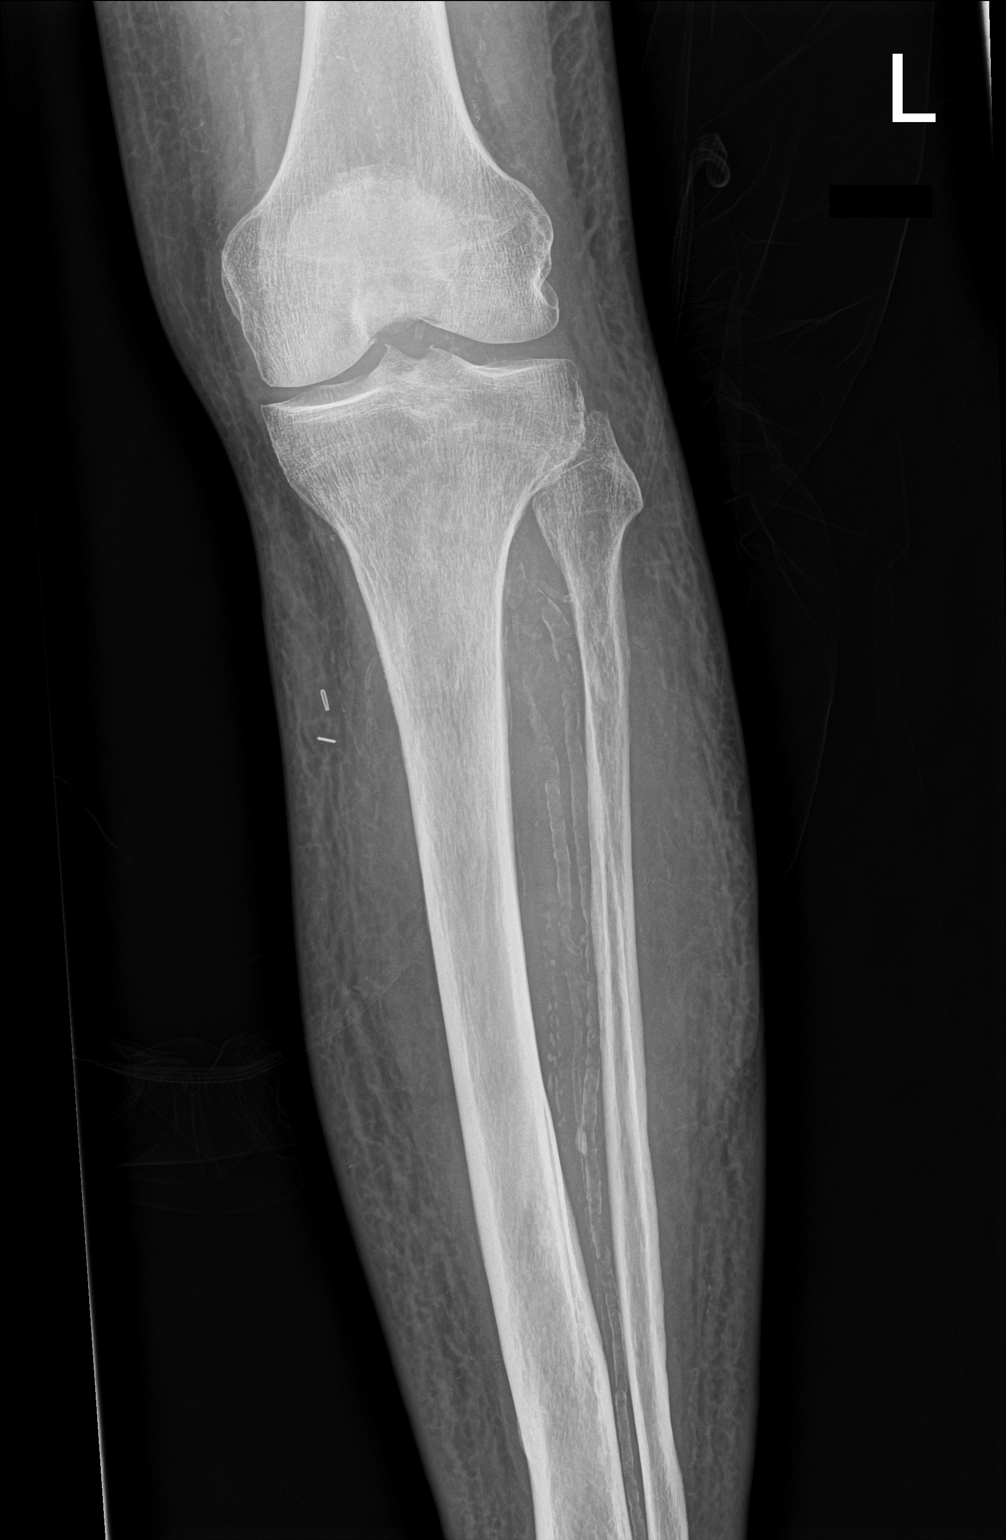

[tibia ap (2 of 2)]
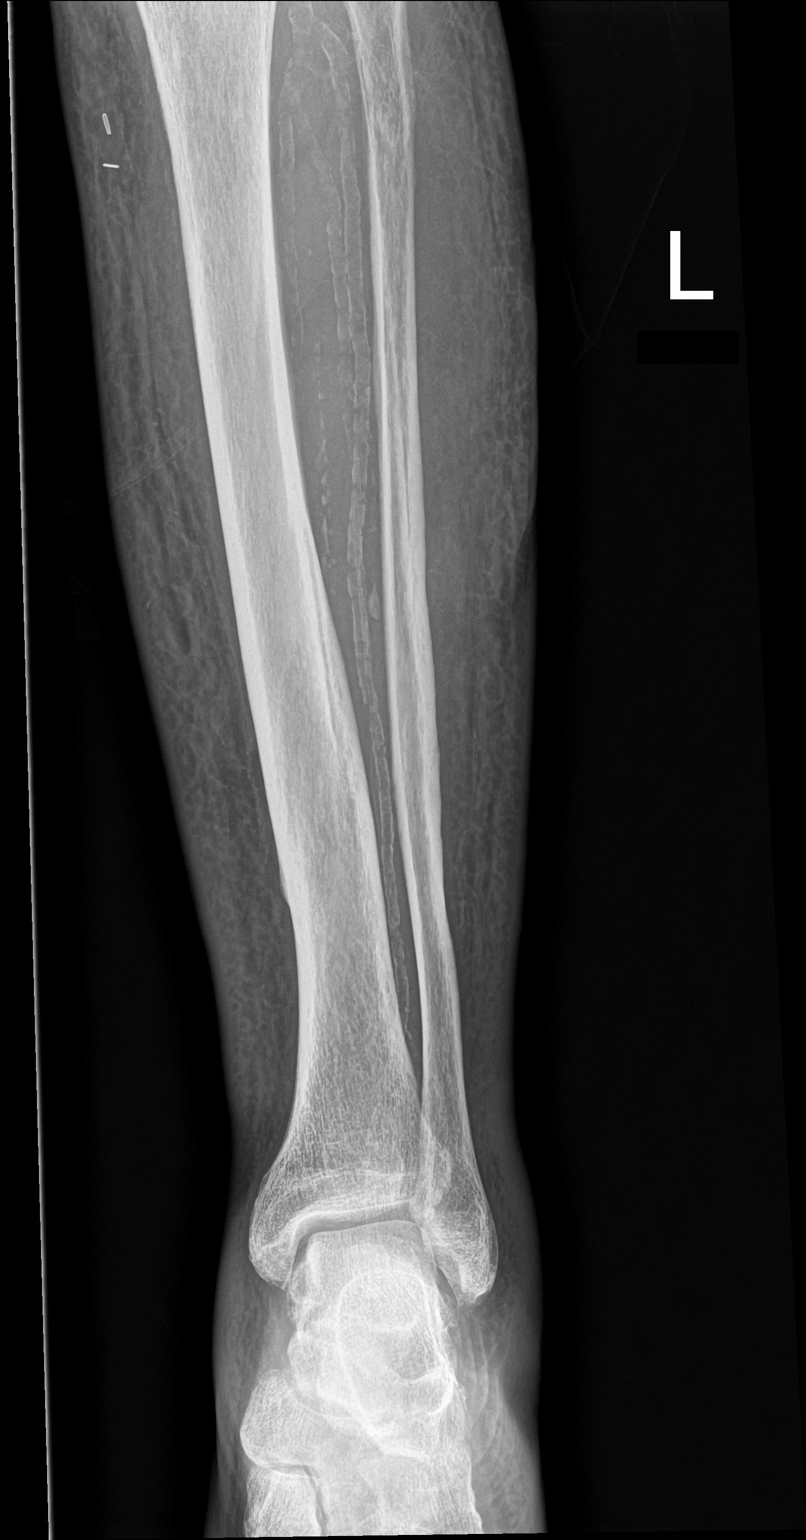

[tibia lat (1 of 2)]
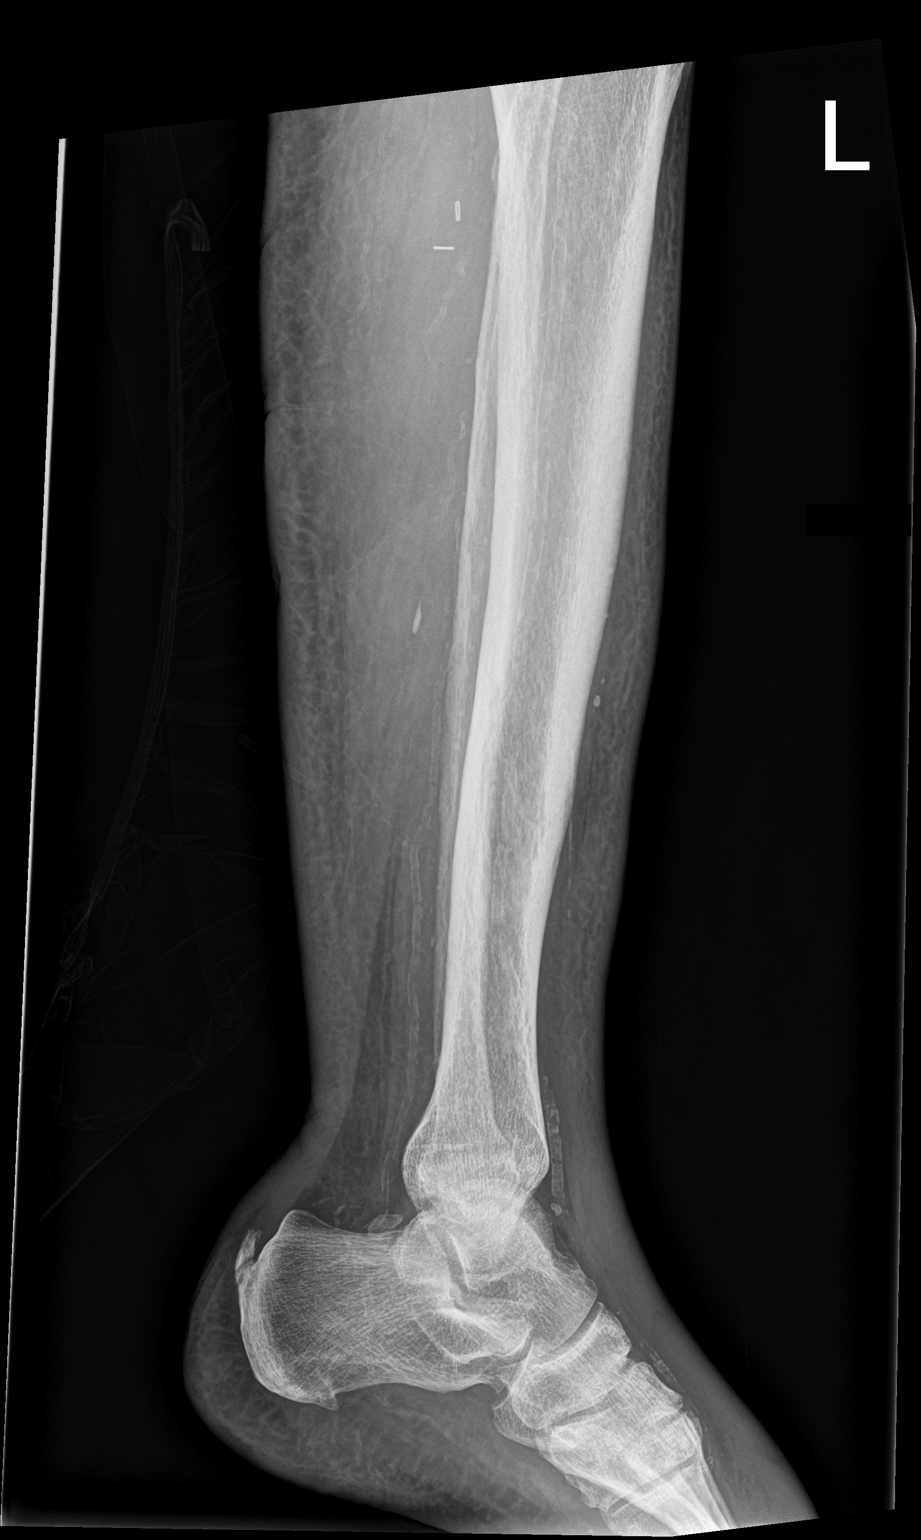

[tibia lat (2 of 2)]
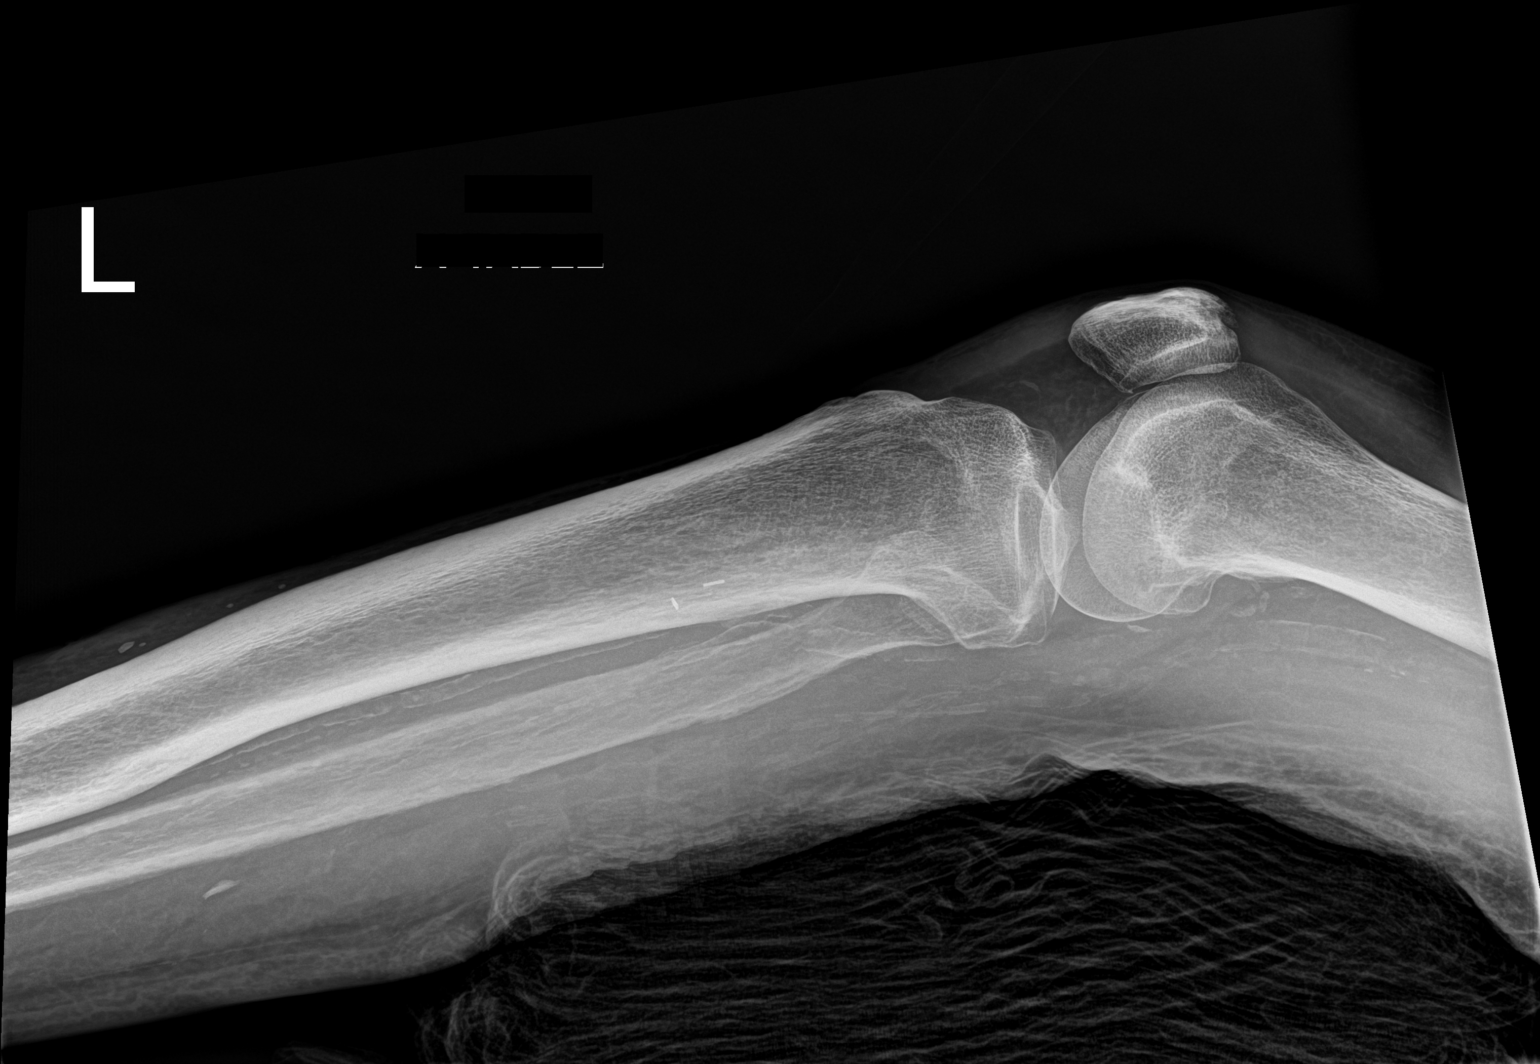

[4 of 4 positions shown; findings below may reference images not displayed]

FINDINGS: There is no evidence of fracture or other focal bone lesions. Marked
soft tissue swelling. Vascular calcification. Skeletal osteopenia.
IMPRESSION: Marked soft tissue swelling. No fracture.

## 2020-03-02 IMAGING — DX LEFT FOOT - 2 VIEW
2 series · 2 of 2 positions shown · non-contrast
Comparison: None.

CLINICAL DATA: 78-year-old male with sepsis

EXAM:
LEFT FOOT - 2 VIEW

[foot ap]
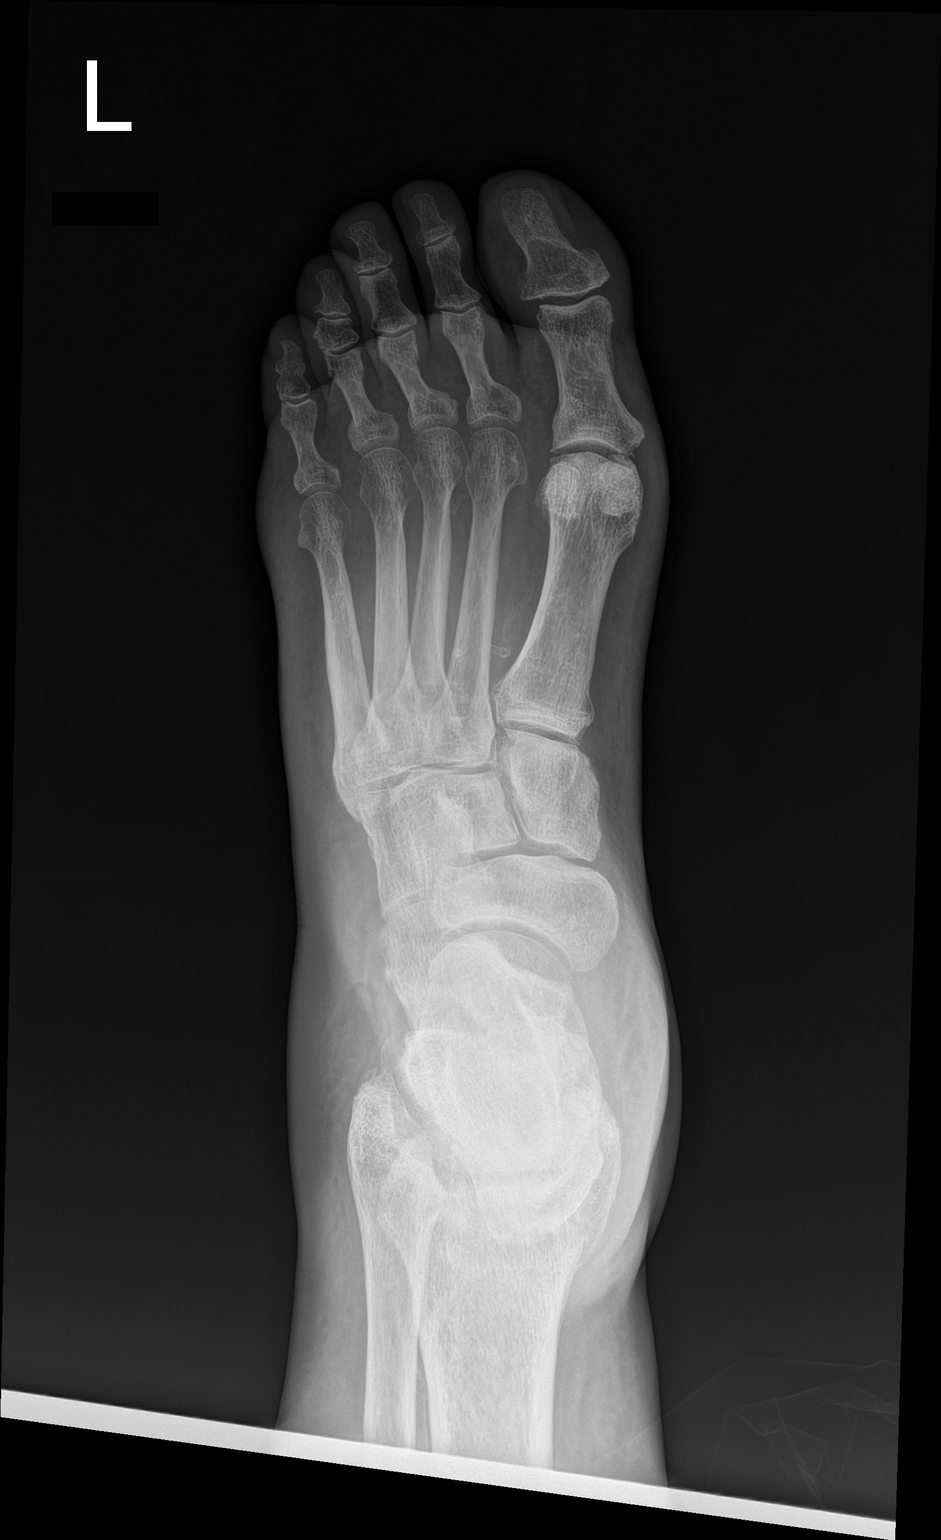

[foot lat]
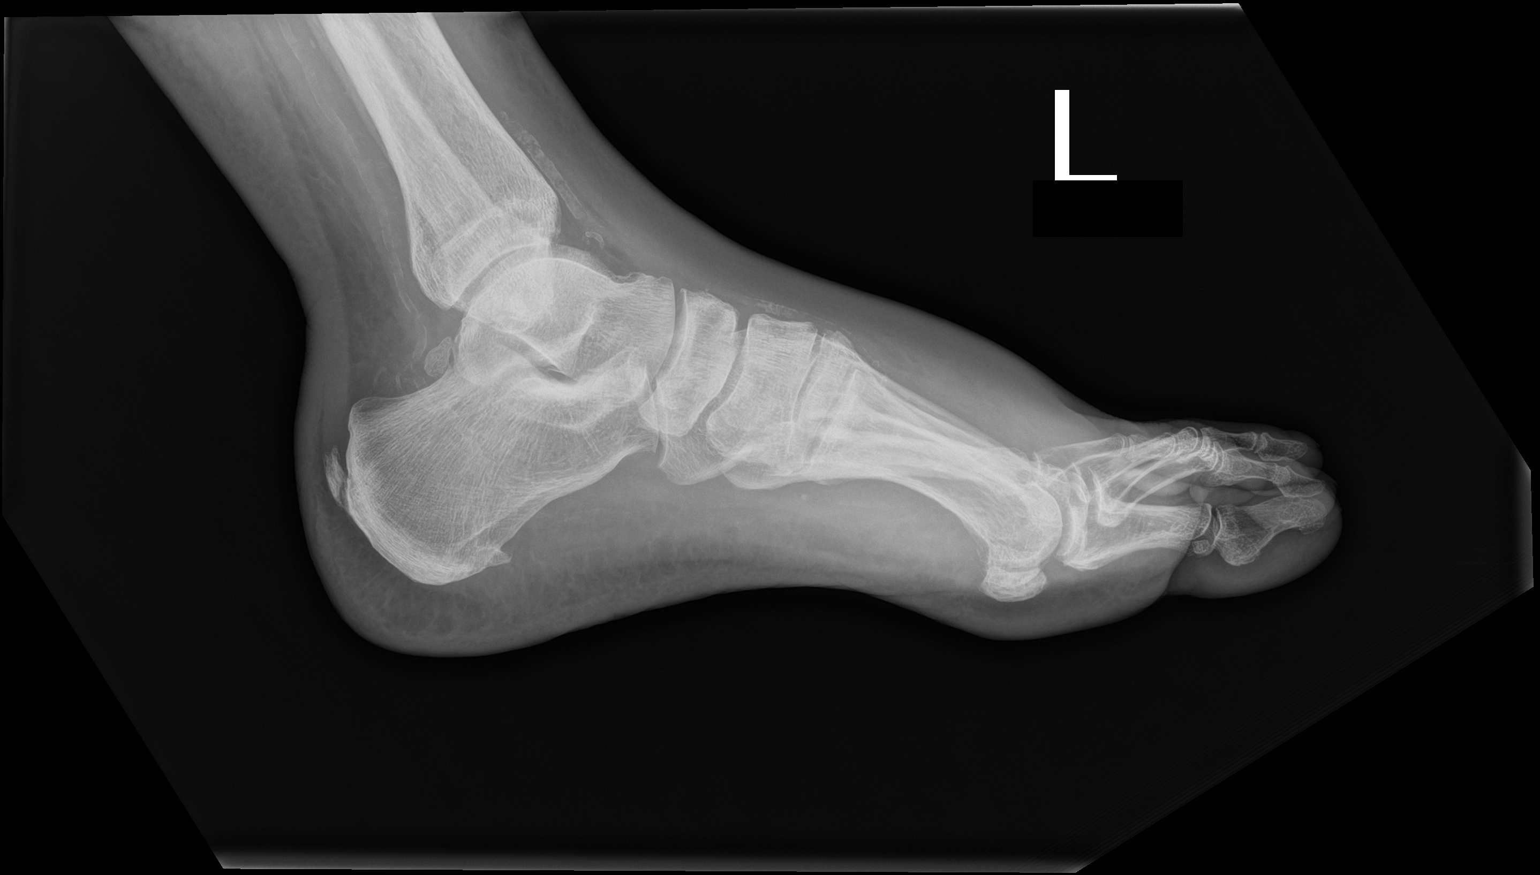

[2 of 2 positions shown; findings below may reference images not displayed]

FINDINGS: Lateral view demonstrates nonspecific soft tissue swelling on the
forefoot, dorsal region. Dense calcifications of the tibial and
pedal vessels. No displaced fracture. No subluxation/dislocation.
Degenerative changes of the interphalangeal joints, midfoot, and
hindfoot.
IMPRESSION: Negative for acute bony abnormality.

Nonspecific soft tissue swelling of the dorsal forefoot.

Tibial and pedal atherosclerosis

## 2020-03-03 IMAGING — DX PORTABLE CHEST - 1 VIEW
1 series · 1 of 1 positions shown · non-contrast
Comparison: Yesterday

CLINICAL DATA: Respiratory failure

EXAM:
PORTABLE CHEST 1 VIEW

[chest]
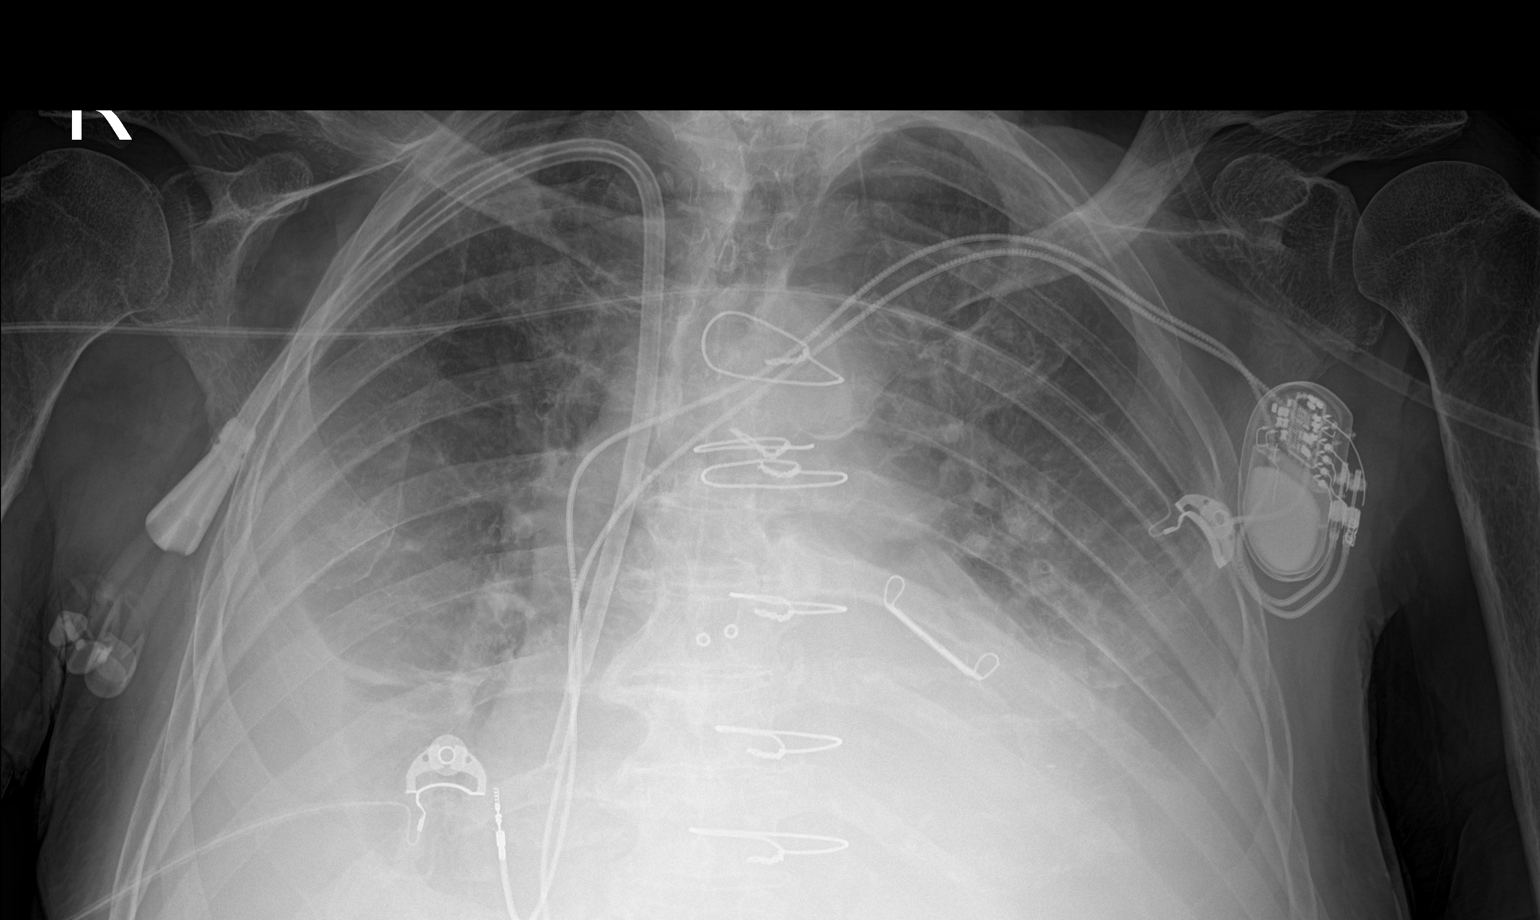

[1 of 1 positions shown; findings below may reference images not displayed]

FINDINGS: Pleural effusions that are likely large. Cardiomegaly. There has
been CABG and left atrial clipping. Dialysis catheter with tip at
the right atrium. Dual-chamber pacer leads from the left.
IMPRESSION: Large layering pleural effusions.

## 2020-03-03 IMAGING — DX PORTABLE CHEST - 1 VIEW
1 series · 1 of 1 positions shown · non-contrast
Comparison: 06/13/2018

CLINICAL DATA: New central line placement

EXAM:
PORTABLE CHEST 1 VIEW

[chest]
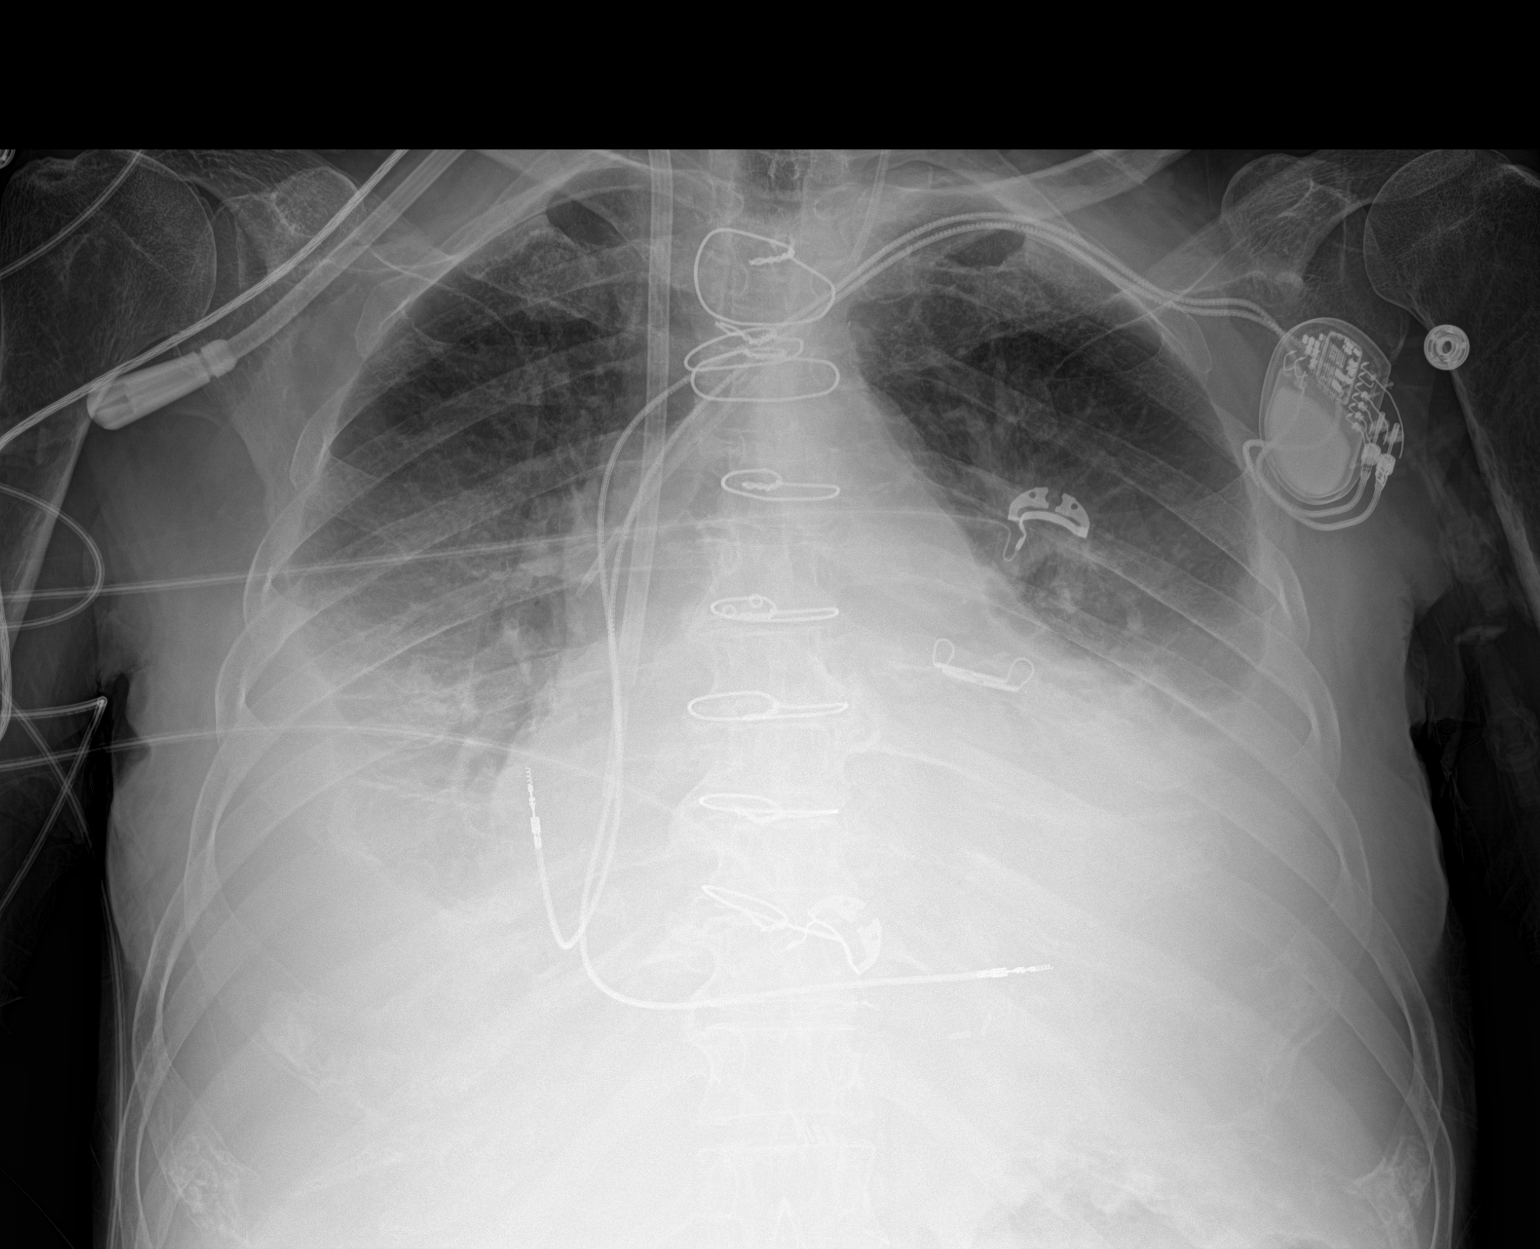

[1 of 1 positions shown; findings below may reference images not displayed]

FINDINGS: Left IJ central venous catheter with the tip projecting over the
SVC. Dual lumen right-sided central venous catheter with the tip
projecting over the cavoatrial junction. There is bilateral small
pleural effusions. There is bilateral interstitial and alveolar
airspace opacity. There is no pneumothorax. The heart and
mediastinal contours are stable. There is evidence of prior CABG.
There is a dual lead cardiac pacemaker.

The osseous structures are unremarkable.
IMPRESSION: 1. Left jugular central venous catheter with the tip projecting over
the SVC
2. CHF.
